# Patient Record
Sex: Female | Born: 1955 | Race: Black or African American | Hispanic: No | Marital: Single | Smoking: Current every day smoker
Health system: Southern US, Community
[De-identification: ages and names within clinical notes are randomized; demographics above are authoritative.]

## PROBLEM LIST (undated history)

## (undated) DIAGNOSIS — I10 Essential (primary) hypertension: Secondary | ICD-10-CM

## (undated) DIAGNOSIS — H9190 Unspecified hearing loss, unspecified ear: Secondary | ICD-10-CM

---

## 2009-01-12 ENCOUNTER — Encounter: Admission: RE | Admit: 2009-01-12 | Discharge: 2009-01-12 | Payer: Self-pay | Admitting: Family Medicine

## 2009-08-25 ENCOUNTER — Emergency Department (HOSPITAL_COMMUNITY): Admission: EM | Admit: 2009-08-25 | Discharge: 2009-08-26 | Payer: Self-pay | Admitting: Emergency Medicine

## 2009-09-13 ENCOUNTER — Ambulatory Visit (HOSPITAL_COMMUNITY)
Admission: RE | Admit: 2009-09-13 | Discharge: 2009-09-13 | Payer: Self-pay | Source: Home / Self Care | Admitting: Obstetrics and Gynecology

## 2009-10-24 ENCOUNTER — Emergency Department (HOSPITAL_COMMUNITY): Admission: EM | Admit: 2009-10-24 | Discharge: 2009-10-25 | Payer: Self-pay | Admitting: Emergency Medicine

## 2010-01-14 ENCOUNTER — Encounter: Admission: RE | Admit: 2010-01-14 | Discharge: 2010-01-14 | Payer: Self-pay | Admitting: Family Medicine

## 2010-10-21 LAB — URINALYSIS, ROUTINE W REFLEX MICROSCOPIC
Bilirubin Urine: NEGATIVE
Glucose, UA: NEGATIVE mg/dL
Ketones, ur: NEGATIVE mg/dL
Nitrite: NEGATIVE
Protein, ur: NEGATIVE mg/dL
Specific Gravity, Urine: 1.015 (ref 1.005–1.030)
Urobilinogen, UA: 0.2 mg/dL (ref 0.0–1.0)
pH: 7 (ref 5.0–8.0)

## 2010-10-21 LAB — WET PREP, GENITAL: Trich, Wet Prep: NONE SEEN

## 2010-10-24 LAB — URINALYSIS, ROUTINE W REFLEX MICROSCOPIC
Ketones, ur: NEGATIVE mg/dL
Leukocytes, UA: NEGATIVE
Protein, ur: NEGATIVE mg/dL
Specific Gravity, Urine: 1.01 (ref 1.005–1.030)
Urobilinogen, UA: 0.2 mg/dL (ref 0.0–1.0)
pH: 6 (ref 5.0–8.0)

## 2010-10-24 LAB — URINE MICROSCOPIC-ADD ON

## 2010-10-24 LAB — BASIC METABOLIC PANEL
BUN: 11 mg/dL (ref 6–23)
Creatinine, Ser: 0.67 mg/dL (ref 0.4–1.2)
GFR calc non Af Amer: >60 mL/min (ref >60)
Glucose, Bld: 94 mg/dL (ref 70–99)
Potassium: 3.2 mEq/L — ABNORMAL LOW (ref 3.5–5.1)
Sodium: 138 mEq/L (ref 135–145)

## 2010-10-24 LAB — CBC
MCHC: 32.5 g/dL (ref 30.0–36.0)
WBC: 4.4 10*3/uL (ref 4.0–10.5)

## 2010-10-24 LAB — ESTRADIOL: Estradiol: 38.2 pg/mL

## 2010-10-24 LAB — FOLLICLE STIMULATING HORMONE: FSH: 49.7 m[IU]/mL

## 2015-03-22 ENCOUNTER — Other Ambulatory Visit: Payer: Self-pay

## 2015-03-22 DIAGNOSIS — Z1231 Encounter for screening mammogram for malignant neoplasm of breast: Secondary | ICD-10-CM

## 2015-04-16 ENCOUNTER — Ambulatory Visit
Admission: RE | Admit: 2015-04-16 | Discharge: 2015-04-16 | Disposition: A | Discharge status: Home / Self Care | Payer: Medicare Other | Source: Ambulatory Visit

## 2015-04-16 DIAGNOSIS — Z1231 Encounter for screening mammogram for malignant neoplasm of breast: Secondary | ICD-10-CM

## 2016-03-13 ENCOUNTER — Other Ambulatory Visit: Payer: Self-pay | Admitting: Nurse Practitioner

## 2016-03-13 ENCOUNTER — Other Ambulatory Visit: Payer: Self-pay | Admitting: Family Medicine

## 2016-03-13 DIAGNOSIS — Z1231 Encounter for screening mammogram for malignant neoplasm of breast: Secondary | ICD-10-CM

## 2016-04-16 ENCOUNTER — Ambulatory Visit
Admission: RE | Admit: 2016-04-16 | Discharge: 2016-04-16 | Disposition: A | Discharge status: Home / Self Care | Payer: Medicare Other | Source: Ambulatory Visit | Attending: Nurse Practitioner | Admitting: Nurse Practitioner

## 2016-04-16 DIAGNOSIS — Z1231 Encounter for screening mammogram for malignant neoplasm of breast: Secondary | ICD-10-CM

## 2016-08-13 ENCOUNTER — Other Ambulatory Visit: Payer: Self-pay | Admitting: Nurse Practitioner

## 2016-08-13 DIAGNOSIS — E2839 Other primary ovarian failure: Secondary | ICD-10-CM

## 2016-08-19 ENCOUNTER — Ambulatory Visit
Admission: RE | Admit: 2016-08-19 | Discharge: 2016-08-19 | Disposition: A | Discharge status: Home / Self Care | Payer: Medicaid Other | Source: Ambulatory Visit | Attending: Nurse Practitioner | Admitting: Nurse Practitioner

## 2016-08-19 DIAGNOSIS — E2839 Other primary ovarian failure: Secondary | ICD-10-CM

## 2017-03-31 ENCOUNTER — Other Ambulatory Visit: Payer: Self-pay | Admitting: Family Medicine

## 2017-03-31 DIAGNOSIS — Z1231 Encounter for screening mammogram for malignant neoplasm of breast: Secondary | ICD-10-CM

## 2017-04-21 ENCOUNTER — Ambulatory Visit
Admission: RE | Admit: 2017-04-21 | Discharge: 2017-04-21 | Disposition: A | Discharge status: Home / Self Care | Payer: Medicare Other | Source: Ambulatory Visit | Attending: Family Medicine | Admitting: Family Medicine

## 2017-04-21 DIAGNOSIS — Z1231 Encounter for screening mammogram for malignant neoplasm of breast: Secondary | ICD-10-CM

## 2017-11-17 ENCOUNTER — Emergency Department (HOSPITAL_COMMUNITY)
Admission: EM | Admit: 2017-11-17 | Discharge: 2017-11-17 | Disposition: A | Discharge status: Home / Self Care | Payer: Medicare Other | Attending: Emergency Medicine | Admitting: Emergency Medicine

## 2017-11-17 ENCOUNTER — Encounter (HOSPITAL_COMMUNITY): Payer: Self-pay | Admitting: Emergency Medicine

## 2017-11-17 DIAGNOSIS — R109 Unspecified abdominal pain: Secondary | ICD-10-CM | POA: Insufficient documentation

## 2017-11-17 DIAGNOSIS — Z5321 Procedure and treatment not carried out due to patient leaving prior to being seen by health care provider: Secondary | ICD-10-CM | POA: Insufficient documentation

## 2017-11-17 LAB — I-STAT BETA HCG BLOOD, ED (MC, WL, AP ONLY)

## 2017-11-17 LAB — COMPREHENSIVE METABOLIC PANEL
ALT: 11 U/L — ABNORMAL LOW (ref 14–54)
ANION GAP: 14 (ref 5–15)
AST: 24 U/L (ref 15–41)
Albumin: 4.3 g/dL (ref 3.5–5.0)
Alkaline Phosphatase: 58 U/L (ref 38–126)
BUN: 13 mg/dL (ref 6–20)
CALCIUM: 9.7 mg/dL (ref 8.9–10.3)
CHLORIDE: 101 mmol/L (ref 101–111)
CO2: 23 mmol/L (ref 22–32)
Creatinine, Ser: 0.83 mg/dL (ref 0.44–1.00)
Glucose, Bld: 144 mg/dL — ABNORMAL HIGH (ref 65–99)
Potassium: 3.7 mmol/L (ref 3.5–5.1)
SODIUM: 138 mmol/L (ref 135–145)
Total Bilirubin: 0.5 mg/dL (ref 0.3–1.2)
Total Protein: 7.6 g/dL (ref 6.5–8.1)

## 2017-11-17 LAB — CBC
HCT: 45 % (ref 36.0–46.0)
HEMOGLOBIN: 15.3 g/dL — AB (ref 12.0–15.0)
MCH: 28.6 pg (ref 26.0–34.0)
MCHC: 34 g/dL (ref 30.0–36.0)
MCV: 84.1 fL (ref 78.0–100.0)
PLATELETS: 173 10*3/uL (ref 150–400)
RBC: 5.35 MIL/uL — AB (ref 3.87–5.11)
RDW: 15.1 % (ref 11.5–15.5)
WBC: 5.3 10*3/uL (ref 4.0–10.5)

## 2017-11-17 LAB — URINALYSIS, ROUTINE W REFLEX MICROSCOPIC
Bacteria, UA: NONE SEEN
Bilirubin Urine: NEGATIVE
GLUCOSE, UA: NEGATIVE mg/dL
KETONES UR: NEGATIVE mg/dL
Nitrite: NEGATIVE
Protein, ur: 30 mg/dL — AB
Specific Gravity, Urine: 1.006 (ref 1.005–1.030)
pH: 7 (ref 5.0–8.0)

## 2017-11-17 LAB — LIPASE, BLOOD: LIPASE: 23 U/L (ref 11–51)

## 2017-11-17 NOTE — ED Triage Notes (Signed)
Triage used via [employer] interpre[employer] - pt speaks ASL. Reports abd pain and nausea x2 hours. Decreased PO intake.

## 2017-11-17 NOTE — ED Notes (Signed)
Called pt x3 for room, no response from pt.  

## 2017-11-19 ENCOUNTER — Other Ambulatory Visit: Payer: Self-pay

## 2017-11-19 ENCOUNTER — Encounter (HOSPITAL_BASED_OUTPATIENT_CLINIC_OR_DEPARTMENT_OTHER): Payer: Self-pay | Admitting: *Deleted

## 2017-11-19 ENCOUNTER — Emergency Department (HOSPITAL_BASED_OUTPATIENT_CLINIC_OR_DEPARTMENT_OTHER)
Admission: EM | Admit: 2017-11-19 | Discharge: 2017-11-20 | Disposition: A | Discharge status: Home / Self Care | Payer: Medicare Other | Attending: Emergency Medicine | Admitting: Emergency Medicine

## 2017-11-19 ENCOUNTER — Emergency Department (HOSPITAL_BASED_OUTPATIENT_CLINIC_OR_DEPARTMENT_OTHER): Payer: Medicare Other

## 2017-11-19 DIAGNOSIS — R1013 Epigastric pain: Secondary | ICD-10-CM | POA: Diagnosis not present

## 2017-11-19 DIAGNOSIS — F17200 Nicotine dependence, unspecified, uncomplicated: Secondary | ICD-10-CM | POA: Insufficient documentation

## 2017-11-19 DIAGNOSIS — R1010 Upper abdominal pain, unspecified: Secondary | ICD-10-CM | POA: Diagnosis present

## 2017-11-19 HISTORY — DX: Unspecified hearing loss, unspecified ear: H91.90

## 2017-11-19 LAB — CBC
HCT: 42.9 % (ref 36.0–46.0)
Hemoglobin: 15 g/dL (ref 12.0–15.0)
MCH: 28.5 pg (ref 26.0–34.0)
MCHC: 35 g/dL (ref 30.0–36.0)
MCV: 81.6 fL (ref 78.0–100.0)
Platelets: 167 10*3/uL (ref 150–400)
RBC: 5.26 MIL/uL — ABNORMAL HIGH (ref 3.87–5.11)
RDW: 14.8 % (ref 11.5–15.5)
WBC: 5.6 10*3/uL (ref 4.0–10.5)

## 2017-11-19 LAB — COMPREHENSIVE METABOLIC PANEL
ALT: 10 U/L — ABNORMAL LOW (ref 14–54)
AST: 20 U/L (ref 15–41)
Albumin: 4.1 g/dL (ref 3.5–5.0)
Alkaline Phosphatase: 42 U/L (ref 38–126)
Anion gap: 12 (ref 5–15)
BUN: 19 mg/dL (ref 6–20)
CHLORIDE: 101 mmol/L (ref 101–111)
CO2: 26 mmol/L (ref 22–32)
Calcium: 9.1 mg/dL (ref 8.9–10.3)
Creatinine, Ser: 0.81 mg/dL (ref 0.44–1.00)
GFR calc Af Amer: >60 mL/min (ref >60)
Glucose, Bld: 91 mg/dL (ref 65–99)
POTASSIUM: 3.5 mmol/L (ref 3.5–5.1)
SODIUM: 139 mmol/L (ref 135–145)
Total Bilirubin: 0.8 mg/dL (ref 0.3–1.2)
Total Protein: 7.1 g/dL (ref 6.5–8.1)

## 2017-11-19 LAB — URINALYSIS, ROUTINE W REFLEX MICROSCOPIC
Bilirubin Urine: NEGATIVE
GLUCOSE, UA: NEGATIVE mg/dL
Ketones, ur: 15 mg/dL — AB
Nitrite: NEGATIVE
Protein, ur: NEGATIVE mg/dL
SPECIFIC GRAVITY, URINE: 1.015 (ref 1.005–1.030)
pH: 6 (ref 5.0–8.0)

## 2017-11-19 LAB — URINALYSIS, MICROSCOPIC (REFLEX)

## 2017-11-19 LAB — LIPASE, BLOOD: LIPASE: 23 U/L (ref 11–51)

## 2017-11-19 MED ORDER — ONDANSETRON HCL 4 MG/2ML IJ SOLN
4.0000 mg | Freq: Once | INTRAMUSCULAR | Status: AC
  Administered 2017-11-19: 4 mg via INTRAVENOUS
  Filled 2017-11-19: qty 2

## 2017-11-19 MED ORDER — IOPAMIDOL (ISOVUE-300) INJECTION 61%
100.0000 mL | Freq: Once | INTRAVENOUS | Status: AC | PRN
  Administered 2017-11-20: 100 mL via INTRAVENOUS

## 2017-11-19 MED ORDER — SODIUM CHLORIDE 0.9 % IV BOLUS
1000.0000 mL | Freq: Once | INTRAVENOUS | Status: AC
  Administered 2017-11-19: 1000 mL via INTRAVENOUS

## 2017-11-19 NOTE — ED Triage Notes (Signed)
Abdominal pain and nausea x 2 days. She was seen in the ED at [facility] with no improvement.

## 2017-11-19 NOTE — ED Provider Notes (Signed)
[facility] EMERGENCY DEPARTMENT Provider Note   CSN: [id number] Arrival date & time: 11/19/17  2004     History   Chief Complaint Chief Complaint  Patient presents with  . Abdominal Pain    HPI Fernando Torry is a 62 y.o. female.  Level 5 caveat for language barrier.  History obtained through sign language interpreter.  Patient presents with upper abdominal pain and nausea and vomiting for the past 2 days.  States the pain started on April 16 after vomiting 3 or 4 times.  She has had constant upper abdominal pain since with poor appetite.  No further vomiting but has had nausea.  She was seen at [facility] but left without being seen 2 days ago.  She denies any vomiting since then.  Has had loose stools but not diarrhea.  She has not had any more vomiting.  No pain with urination or blood in the urine.  No vaginal bleeding or discharge.  No chest pain or shortness of breath.  She denies any history of ulcers or acid reflux.  She has not had this pain before.  No previous abdominal surgeries.  The history is provided by the patient. The history is limited by a language barrier.    Past Medical History:  Diagnosis Date  . Hard of hearing     There are no active problems to display for this patient.   History reviewed. No pertinent surgical history.   OB History   None      Home Medications    Prior to Admission medications   Not on File    Family History No family history on file.  Social History Social History   Tobacco Use  . Smoking status: Current Every Day Smoker  . Smokeless tobacco: Never Used  Substance Use Topics  . Alcohol use: Yes  . Drug use: Never     Allergies   Patient has no known allergies.   Review of Systems Review of Systems  Constitutional: Positive for appetite change. Negative for fatigue.  HENT: Negative for congestion.   Eyes: Negative for visual disturbance.  Respiratory: Negative for cough, chest tightness and  shortness of breath.   Cardiovascular: Negative for chest pain.  Gastrointestinal: Positive for abdominal pain, nausea and vomiting. Negative for diarrhea.  Genitourinary: Negative for dysuria, hematuria, vaginal bleeding and vaginal discharge.  Musculoskeletal: Negative for arthralgias, back pain and myalgias.  Skin: Negative for rash.  Neurological: Negative for dizziness, weakness and headaches.   all other systems are negative except as noted in the HPI and PMH.     Physical Exam Updated Vital Signs BP 136/87 (BP Location: Right Arm)   Pulse 64   Temp 98.1 F (36.7 C) (Oral)   Resp 16   Ht 5\' 6"  (1.676 m)   Wt 68 kg (150 lb)   SpO2 100%   BMI 24.21 kg/m   Physical Exam  Constitutional: She is oriented to person, place, and time. She appears well-developed and well-nourished. No distress.  HENT:  Head: Normocephalic and atraumatic.  Mouth/Throat: Oropharynx is clear and moist. No oropharyngeal exudate.  Eyes: Pupils are equal, round, and reactive to light. Conjunctivae and EOM are normal.  Neck: Normal range of motion. Neck supple.  No meningismus.  Cardiovascular: Normal rate, regular rhythm, normal heart sounds and intact distal pulses.  No murmur heard. Pulmonary/Chest: Effort normal and breath sounds normal. No respiratory distress.  Abdominal: Soft. There is tenderness. There is no rebound and no guarding.  Epigastric upper abdominal tenderness  voluntary guarding No RLQ tenderness No RUQ tenderness  Musculoskeletal: Normal range of motion. She exhibits no edema or tenderness.  Neurological: She is alert and oriented to person, place, and time. No cranial nerve deficit. She exhibits normal muscle tone. Coordination normal.  No ataxia on finger to nose bilaterally. No pronator drift. 5/5 strength throughout. CN 2-12 intact.Equal grip strength. Sensation intact.   Skin: Skin is warm.  Psychiatric: She has a normal mood and affect. Her behavior is normal.  Nursing  note and vitals reviewed.    ED Treatments / Results  Labs (all labs ordered are listed, but only abnormal results are displayed) Labs Reviewed  COMPREHENSIVE METABOLIC PANEL - Abnormal; Notable for the following components:      Result Value   ALT 10 (*)    All other components within normal limits  CBC - Abnormal; Notable for the following components:   RBC 5.26 (*)    All other components within normal limits  URINALYSIS, ROUTINE W REFLEX MICROSCOPIC - Abnormal; Notable for the following components:   Hgb urine dipstick TRACE (*)    Ketones, ur 15 (*)    Leukocytes, UA TRACE (*)    All other components within normal limits  URINALYSIS, MICROSCOPIC (REFLEX) - Abnormal; Notable for the following components:   Bacteria, UA RARE (*)    Squamous Epithelial / LPF 0-5 (*)    All other components within normal limits  LIPASE, BLOOD    EKG None  Radiology Ct Abdomen Pelvis W Contrast  Result Date: 11/20/2017 CLINICAL DATA:  Mid abdominal pain for 2 days with French Anaracy material. EXAM: CT ABDOMEN AND PELVIS WITH CONTRAST TECHNIQUE: Multidetector CT imaging of the abdomen and pelvis was performed using the standard protocol following bolus administration of intravenous contrast. CONTRAST:  100mL ISOVUE-300 IOPAMIDOL (ISOVUE-300) INJECTION 61% COMPARISON:  05/14/2015 CT FINDINGS: Lower chest: Normal heart size without pericardial effusion. There is minimal atelectasis along the dependent aspect of both lung bases. No effusion or pulmonary consolidation. No pneumothorax. Hepatobiliary: Homogeneous appearance of the liver. Nondistended gallbladder without stones. No biliary dilatation. Pancreas: Normal pancreas without inflammation or mass. Spleen: Normal Adrenals/Urinary Tract: Normal bilateral adrenal glands. 9 mm stable cyst in the interpolar left kidney. No obstructive uropathy. Unremarkable urinary bladder without focal mural thickening or calculus. Repeat delayed images demonstrate symmetric  pyelograms. Stomach/Bowel: Physiologic distention of the stomach with normal small bowel rotation. No small bowel dilatation or inflammation. No obstruction. Normal-appearing appendix. Moderate fecal retention within the colon. No large bowel obstruction or mural thickening. There is scattered colonic diverticulosis along the sigmoid colon without acute diverticulitis. Vascular/Lymphatic: Mild aortoiliac atherosclerosis. Circumaortic left renal vein. No adenopathy. Reproductive: Redemonstration of uterine fibroids, the largest is posterior measuring approximately 6.4 cm and without significant change. No adnexal mass. Other: No abdominal wall hernia or abnormality. No abdominopelvic ascites. 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 Gentle S-shaped scoliosis of the thoracolumbar spine. IMPRESSION: 1. Stable appearing uterine fibroids. 2. Sigmoid diverticulosis without acute diverticulitis. No bowel obstruction or inflammation. 3. Stable left interpolar renal cyst approximately 9 mm in diameter. No nephrolithiasis nor enhancing renal masses. Electronically Signed   By: Tollie Ethavid  Kwon M.D.   On: 11/20/2017 01:22    Procedures Procedures (including critical care time)  Medications Ordered in ED Medications - No data to display   Initial Impression / Assessment and Plan / ED Course  I have reviewed the triage vital signs  and the nursing notes.  Pertinent labs & imaging results that were available during my care of the patient were reviewed by me and considered in my medical decision making (see chart for details).     Upper abdominal pain with nausea and vomiting. LFTs and lipase normal.   LFTs and lipase are normal.  Labs are reassuring.  Urinalysis is negative.  Patient feels improved after GI cocktail and IV PPI.  Low suspicion for gallbladder pathology.  CT scan obtained shows fibroids as well as renal cyst.  No acute  pathology.  Will start PPI.  Discussed with patient to avoid alcohol, NSAIDs, caffeine, spicy foods.  Follow-up with PCP.  Return precautions discussed. Final Clinical Impressions(s) / ED Diagnoses   Final diagnoses:  Epigastric pain    ED Discharge Orders    None       Lashon Beringer, Jeannett Senior, MD 11/20/17 0236

## 2017-11-20 DIAGNOSIS — R1013 Epigastric pain: Secondary | ICD-10-CM | POA: Diagnosis not present

## 2017-11-20 MED ORDER — FAMOTIDINE IN NACL 20-0.9 MG/50ML-% IV SOLN
20.0000 mg | Freq: Two times a day (BID) | INTRAVENOUS | Status: DC
  Administered 2017-11-20: 20 mg via INTRAVENOUS
  Filled 2017-11-20: qty 50

## 2017-11-20 MED ORDER — OMEPRAZOLE 20 MG PO CPDR: 20.0000 mg | DELAYED_RELEASE_CAPSULE | Freq: Every day | ORAL | 0 refills | Status: AC

## 2017-11-20 MED ORDER — ONDANSETRON 4 MG PO TBDP
4.0000 mg | ORAL_TABLET | Freq: Once | ORAL | Status: AC
  Administered 2017-11-20: 4 mg via ORAL
  Filled 2017-11-20: qty 1

## 2017-11-20 MED ORDER — GI COCKTAIL ~~LOC~~
30.0000 mL | Freq: Once | ORAL | Status: AC
  Administered 2017-11-20: 30 mL via ORAL
  Filled 2017-11-20: qty 30

## 2017-11-20 NOTE — Discharge Instructions (Addendum)
Your stomach pain is likely coming from your stomach or esophagus.  Take the medication as prescribed.  Avoid alcohol, NSAIDs, caffeine, spicy foods.  Follow-up with your doctor.  Return to the ED if you develop new or worsening symptoms.

## 2017-11-26 ENCOUNTER — Emergency Department (HOSPITAL_COMMUNITY)
Admission: EM | Admit: 2017-11-26 | Discharge: 2017-11-26 | Disposition: A | Discharge status: Home / Self Care | Payer: Medicare Other | Attending: Emergency Medicine | Admitting: Emergency Medicine

## 2017-11-26 ENCOUNTER — Other Ambulatory Visit: Payer: Self-pay

## 2017-11-26 ENCOUNTER — Encounter (HOSPITAL_COMMUNITY): Payer: Self-pay

## 2017-11-26 DIAGNOSIS — K611 Rectal abscess: Secondary | ICD-10-CM | POA: Insufficient documentation

## 2017-11-26 DIAGNOSIS — I10 Essential (primary) hypertension: Secondary | ICD-10-CM | POA: Insufficient documentation

## 2017-11-26 DIAGNOSIS — F17210 Nicotine dependence, cigarettes, uncomplicated: Secondary | ICD-10-CM | POA: Insufficient documentation

## 2017-11-26 HISTORY — DX: Essential (primary) hypertension: I10

## 2017-11-26 MED ORDER — CLINDAMYCIN HCL 150 MG PO CAPS: 300.0000 mg | ORAL_CAPSULE | Freq: Four times a day (QID) | ORAL | 0 refills | Status: AC

## 2017-11-26 NOTE — ED Provider Notes (Signed)
[facility] EMERGENCY DEPARTMENT Provider Note   CSN: [id number] Arrival date & time: 11/26/17  1706     History   Chief Complaint Chief Complaint  Patient presents with  . Hemorrhoids    HPI Amy Baird is a 62 y.o. female.  HPI Amy Baird is a 62 y.o. female with history of hypertension, presents to emergency department complaining of rectal pain.  Patient noticed rectal pain several days ago.  She states there is pain when she has a bowel movement.  Denies any rectal bleeding.  No fever or chills.  States area around her rectum is tender to the touch.  Denies any drainage.  Denies any hemorrhoids.  She has not tried any treatment prior to coming in.  No history of the same.  Denies constipation or straining while having bowel movements.  Past Medical History:  Diagnosis Date  . Hard of hearing   . Hypertension     There are no active problems to display for this patient.   History reviewed. No pertinent surgical history.   OB History   None      Home Medications    Prior to Admission medications   Medication Sig Start Date End Date Taking? Authorizing Provider  omeprazole (PRILOSEC) 20 MG capsule Take 1 capsule (20 mg total) by mouth daily. 11/20/17   Glynn Octave, MD    Family History History reviewed. No pertinent family history.  Social History Social History   Tobacco Use  . Smoking status: Current Every Day Smoker    Packs/day: 0.50    Types: Cigarettes  . Smokeless tobacco: Never Used  Substance Use Topics  . Alcohol use: Never    Frequency: Never  . Drug use: Never     Allergies   Patient has no known allergies.   Review of Systems Review of Systems  Constitutional: Negative for chills and fever.  Respiratory: Negative for cough, chest tightness and shortness of breath.   Cardiovascular: Negative for chest pain, palpitations and leg swelling.  Gastrointestinal: Positive for rectal pain. Negative for  abdominal pain, blood in stool, diarrhea, nausea and vomiting.  Genitourinary: Negative for dysuria, flank pain, pelvic pain, vaginal bleeding, vaginal discharge and vaginal pain.  Musculoskeletal: Negative for arthralgias, myalgias, neck pain and neck stiffness.  Skin: Negative for rash.  Neurological: Negative for dizziness, weakness and headaches.  All other systems reviewed and are negative.    Physical Exam Updated Vital Signs BP (!) 154/84 (BP Location: Right Arm)   Pulse 77   Temp 98.2 F (36.8 C) (Oral)   Resp 16   Ht 5\' 6"  (1.676 m)   Wt 68 kg (150 lb)   SpO2 98%   BMI 24.21 kg/m   Physical Exam  Constitutional: She appears well-developed and well-nourished. No distress.  HENT:  Head: Normocephalic.  Eyes: Conjunctivae are normal.  Neck: Neck supple.  Cardiovascular: Normal rate, regular rhythm and normal heart sounds.  Pulmonary/Chest: Effort normal and breath sounds normal. No respiratory distress. She has no wheezes. She has no rales.  Abdominal: There is no tenderness.  Genitourinary:     Genitourinary Comments: Left perianal abscess, draining.  Abscess appears to be very small, approximately 1 cm in diameter upon palpation.  No other areas of tenderness, erythema, induration.  Rectal exam otherwise unremarkable.  Musculoskeletal: She exhibits no edema.  Neurological: She is alert.  Skin: Skin is warm and dry.  Psychiatric: She has a normal mood and affect. Her behavior is normal.  Nursing note and vitals reviewed.    ED Treatments / Results  Labs (all labs ordered are listed, but only abnormal results are displayed) Labs Reviewed - No data to display  EKG None  Radiology No results found.  Procedures Procedures (including critical care time)  Medications Ordered in ED Medications - No data to display   Initial Impression / Assessment and Plan / ED Course  I have reviewed the triage vital signs and the nursing notes.  Pertinent labs &  imaging results that were available during my care of the patient were reviewed by me and considered in my medical decision making (see chart for details).    Patient with a small perirectal abscess that is already draining.  She is afebrile, nontoxic-appearing.  Rectal exam otherwise unremarkable.  I will start patient on antibiotics, discussed sitz bath's, follow-up with family doctor as needed.  Return precautions discussed.  Advised to come back up is getting larger and stop straining for incision and drainage in the ER.  Vitals:   11/26/17 1736 11/26/17 1737  BP: (!) 154/84   Pulse: 77   Resp: 16   Temp: 98.2 F (36.8 C)   TempSrc: Oral   SpO2: 98%   Weight:  68 kg (150 lb)  Height:  5\' 6"  (1.676 m)     Final Clinical Impressions(s) / ED Diagnoses   Final diagnoses:  Perirectal abscess    ED Discharge Orders        Ordered    clindamycin (CLEOCIN) 150 MG capsule  Every 6 hours     11/26/17 1830       Jaynie CrumbleKirichenko, Gerrod Maule, PA-C 11/26/17 1831    Mesner, Barbara CowerJason, MD 11/30/17 2138

## 2017-11-26 NOTE — ED Notes (Signed)
Deaf Interpretor paged

## 2017-11-26 NOTE — ED Triage Notes (Signed)
Pt endorses hemorrhoid since yesterday. No blood or drainage observed, VSS. Pt is deaf but reads lips. VSS.

## 2017-11-26 NOTE — Discharge Instructions (Addendum)
Warm soapy baths several times a day.  Take antibiotic as prescribed until all gone.  Follow-up with family doctor in 2 to 3 days for recheck.  Return if it is getting worse.

## 2017-11-26 NOTE — ED Notes (Signed)
ED Provider at bedside. 

## 2018-05-28 ENCOUNTER — Other Ambulatory Visit: Payer: Self-pay | Admitting: Family Medicine

## 2018-05-28 DIAGNOSIS — Z1231 Encounter for screening mammogram for malignant neoplasm of breast: Secondary | ICD-10-CM

## 2018-12-14 ENCOUNTER — Other Ambulatory Visit: Payer: Self-pay | Admitting: Family Medicine

## 2018-12-14 DIAGNOSIS — N6320 Unspecified lump in the left breast, unspecified quadrant: Secondary | ICD-10-CM

## 2018-12-31 ENCOUNTER — Other Ambulatory Visit: Payer: Self-pay | Admitting: Nurse Practitioner

## 2019-01-03 ENCOUNTER — Other Ambulatory Visit: Payer: Self-pay | Admitting: Physician Assistant

## 2019-01-03 DIAGNOSIS — N6320 Unspecified lump in the left breast, unspecified quadrant: Secondary | ICD-10-CM

## 2019-01-04 ENCOUNTER — Ambulatory Visit
Admission: RE | Admit: 2019-01-04 | Discharge: 2019-01-04 | Disposition: A | Discharge status: Home / Self Care | Payer: Medicare Other | Source: Ambulatory Visit | Attending: Family Medicine | Admitting: Family Medicine

## 2019-01-04 ENCOUNTER — Other Ambulatory Visit: Payer: Self-pay

## 2019-01-04 DIAGNOSIS — N6320 Unspecified lump in the left breast, unspecified quadrant: Secondary | ICD-10-CM

## 2019-03-29 ENCOUNTER — Other Ambulatory Visit: Payer: Self-pay | Admitting: Nurse Practitioner

## 2019-03-29 DIAGNOSIS — Z1231 Encounter for screening mammogram for malignant neoplasm of breast: Secondary | ICD-10-CM

## 2019-05-13 ENCOUNTER — Ambulatory Visit
Admission: RE | Admit: 2019-05-13 | Discharge: 2019-05-13 | Disposition: A | Discharge status: Home / Self Care | Payer: Medicare Other | Source: Ambulatory Visit | Attending: Nurse Practitioner | Admitting: Nurse Practitioner

## 2019-05-13 ENCOUNTER — Other Ambulatory Visit: Payer: Self-pay

## 2019-05-13 DIAGNOSIS — Z1231 Encounter for screening mammogram for malignant neoplasm of breast: Secondary | ICD-10-CM

## 2020-03-20 ENCOUNTER — Emergency Department (HOSPITAL_COMMUNITY)
Admission: EM | Admit: 2020-03-20 | Discharge: 2020-03-21 | Disposition: A | Discharge status: Home / Self Care | Payer: Medicare Other | Attending: Emergency Medicine | Admitting: Emergency Medicine

## 2020-03-20 DIAGNOSIS — L03114 Cellulitis of left upper limb: Secondary | ICD-10-CM | POA: Diagnosis not present

## 2020-03-20 DIAGNOSIS — I10 Essential (primary) hypertension: Secondary | ICD-10-CM | POA: Diagnosis not present

## 2020-03-20 DIAGNOSIS — T63451A Toxic effect of venom of hornets, accidental (unintentional), initial encounter: Secondary | ICD-10-CM

## 2020-03-20 DIAGNOSIS — F17210 Nicotine dependence, cigarettes, uncomplicated: Secondary | ICD-10-CM | POA: Diagnosis not present

## 2020-03-20 DIAGNOSIS — T63461A Toxic effect of venom of wasps, accidental (unintentional), initial encounter: Secondary | ICD-10-CM | POA: Diagnosis not present

## 2020-03-20 DIAGNOSIS — Z23 Encounter for immunization: Secondary | ICD-10-CM | POA: Diagnosis not present

## 2020-03-20 DIAGNOSIS — M79602 Pain in left arm: Secondary | ICD-10-CM | POA: Diagnosis present

## 2020-03-21 ENCOUNTER — Encounter (HOSPITAL_COMMUNITY): Payer: Self-pay | Admitting: Emergency Medicine

## 2020-03-21 DIAGNOSIS — T63461A Toxic effect of venom of wasps, accidental (unintentional), initial encounter: Secondary | ICD-10-CM | POA: Diagnosis not present

## 2020-03-21 MED ORDER — DOXYCYCLINE HYCLATE 100 MG PO TABS
100.0000 mg | ORAL_TABLET | Freq: Once | ORAL | Status: AC
  Administered 2020-03-21: 100 mg via ORAL
  Filled 2020-03-21: qty 1

## 2020-03-21 MED ORDER — DOXYCYCLINE HYCLATE 100 MG PO CAPS
100.0000 mg | ORAL_CAPSULE | Freq: Two times a day (BID) | ORAL | 0 refills | Status: AC
Start: 2020-03-21 — End: 2020-03-28

## 2020-03-21 MED ORDER — TETANUS-DIPHTH-ACELL PERTUSSIS 5-2.5-18.5 LF-MCG/0.5 IM SUSP
0.5000 mL | Freq: Once | INTRAMUSCULAR | Status: AC
  Administered 2020-03-21: 0.5 mL via INTRAMUSCULAR
  Filled 2020-03-21: qty 0.5

## 2020-03-21 NOTE — ED Triage Notes (Signed)
Patient reports she was stung by bee L FA on Sunday. Mild swelling noted. Patient reports itching. Has not attempted OTC measures. ASL interpreter used.

## 2020-03-21 NOTE — ED Provider Notes (Signed)
[facility] EMERGENCY DEPARTMENT Provider Note  CSN: [id number] Arrival date & time: 03/20/20 2351  Chief Complaint(s) Insect Bite  HPI Amy Baird is a 64 y.o. female here with left arm pain, swelling, and redness after a bee/hornet sting on Saturday. Initially she felt pain related to the sting, which resolved the following day. Yesterday, the area began to hurt and turn red. Pain with palpation. No alleviating factors. No SOB, throat swelling or rash. No fevers, CP.  The history is provided by the patient. The history is limited by a language barrier. A language interpreter was used (ASL).    Past Medical History Past Medical History:  Diagnosis Date   Hard of hearing    Hypertension    There are no problems to display for this patient.  Home Medication(s) Prior to Admission medications   Medication Sig Start Date End Date Taking? Authorizing Provider  clindamycin (CLEOCIN) 150 MG capsule Take 2 capsules (300 mg total) by mouth every 6 (six) hours. 11/26/17   Kirichenko, Lemont Fillers, PA-C  doxycycline (VIBRAMYCIN) 100 MG capsule Take 1 capsule (100 mg total) by mouth 2 (two) times daily for 7 days. 03/21/20 03/28/20  Nira Conn, MD  omeprazole (PRILOSEC) 20 MG capsule Take 1 capsule (20 mg total) by mouth daily. 11/20/17   Glynn Octave, MD                                                                                                                                    Past Surgical History History reviewed. No pertinent surgical history. Family History No family history on file.  Social History Social History   Tobacco Use   Smoking status: Current Every Day Smoker    Packs/day: 0.50    Types: Cigarettes   Smokeless tobacco: Never Used  Substance Use Topics   Alcohol use: Never   Drug use: Never   Allergies Patient has no known allergies.  Review of Systems Review of Systems All other systems are reviewed and are negative for  acute change except as noted in the HPI  Physical Exam Vital Signs  I have reviewed the triage vital signs BP (!) 129/93    Pulse 67    Temp (!) 97.4 F (36.3 C) (Oral)    Resp 16    Ht 5\' 6"  (1.676 m)    Wt 68 kg    SpO2 99%    BMI 24.20 kg/m   Physical Exam Vitals reviewed.  Constitutional:      General: She is not in acute distress.    Appearance: She is well-developed. She is not diaphoretic.  HENT:     Head: Normocephalic and atraumatic.     Right Ear: External ear normal.     Left Ear: External ear normal.     Nose: Nose normal.  Eyes:     General: No scleral icterus.    Conjunctiva/sclera: Conjunctivae normal.  Neck:  Trachea: Phonation normal.  Cardiovascular:     Rate and Rhythm: Normal rate and regular rhythm.  Pulmonary:     Effort: Pulmonary effort is normal. No respiratory distress.     Breath sounds: No stridor.  Abdominal:     General: There is no distension.  Musculoskeletal:        General: Normal range of motion.       Arms:     Cervical back: Normal range of motion.  Neurological:     Mental Status: She is alert and oriented to person, place, and time.  Psychiatric:        Behavior: Behavior normal.     ED Results and Treatments Labs (all labs ordered are listed, but only abnormal results are displayed) Labs Reviewed - No data to display                                                                                                                       EKG  EKG Interpretation  Date/Time:    Ventricular Rate:    PR Interval:    QRS Duration:   QT Interval:    QTC Calculation:   R Axis:     Text Interpretation:        Radiology No results found.  Pertinent labs & imaging results that were available during my care of the patient were reviewed by me and considered in my medical decision making (see chart for details).  Medications Ordered in ED Medications  Tdap (BOOSTRIX) injection 0.5 mL (0.5 mLs Intramuscular Given 03/21/20  0639)  doxycycline (VIBRA-TABS) tablet 100 mg (100 mg Oral Given 03/21/20 1219)                                                                                                                                    Procedures Procedures  (including critical care time)  Medical Decision Making / ED Course I have reviewed the nursing notes for this encounter and the patient's prior records (if available in EHR or on provided paperwork).   Amy Baird was evaluated in Emergency Department on 03/21/2020 for the symptoms described in the history of present illness. She was evaluated in the context of the global COVID-19 pandemic, which necessitated consideration that the patient might be at risk for infection with the SARS-CoV-2 virus that causes COVID-19. Institutional protocols and algorithms that pertain to the evaluation of patients at risk for  COVID-19 are in a state of rapid change based on information released by regulatory bodies including the CDC and federal and state organizations. These policies and algorithms were followed during the patient's care in the ED.  Will treat for likely cellulitis related to hornet sting. No evidence of allergic reaction. Tdap booster given. Doxy dose given.      Final Clinical Impression(s) / ED Diagnoses Final diagnoses:  Hornet sting, accidental or unintentional, initial encounter  Cellulitis of left upper extremity    The patient appears reasonably screened and/or stabilized for discharge and I doubt any other medical condition or other [facility] requiring further screening, evaluation, or treatment in the ED at this time prior to discharge. Safe for discharge with strict return precautions.  Disposition: Discharge  Condition: Good  I have discussed the results, Dx and Tx plan with the patient/family who expressed understanding and agree(s) with the plan. Discharge instructions discussed at length. The patient/family was given strict return precautions  who verbalized understanding of the instructions. No further questions at time of discharge.    ED Discharge Orders         Ordered    doxycycline (VIBRAMYCIN) 100 MG capsule  2 times daily     Discontinue  Reprint     03/21/20 0634            Follow Up: Avel Sensor, FNP [address] [place] [address] [phone]  Schedule an appointment as soon as possible for a visit  If symptoms do not improve or  worsen     This chart was dictated using voice recognition software.  Despite best efforts to proofread,  errors can occur which can change the documentation meaning.   Nira Conn, MD 03/21/20 [phone]

## 2020-03-21 NOTE — ED Notes (Signed)
Verbalized understanding of DC instructions, Rx, follow up care 

## 2020-04-30 ENCOUNTER — Emergency Department (HOSPITAL_COMMUNITY)
Admission: EM | Admit: 2020-04-30 | Discharge: 2020-04-30 | Disposition: A | Discharge status: Home / Self Care | Payer: Medicare Other | Attending: Emergency Medicine | Admitting: Emergency Medicine

## 2020-04-30 ENCOUNTER — Encounter (HOSPITAL_COMMUNITY): Payer: Self-pay | Admitting: Emergency Medicine

## 2020-04-30 ENCOUNTER — Other Ambulatory Visit: Payer: Self-pay

## 2020-04-30 DIAGNOSIS — N760 Acute vaginitis: Secondary | ICD-10-CM | POA: Insufficient documentation

## 2020-04-30 DIAGNOSIS — Z5321 Procedure and treatment not carried out due to patient leaving prior to being seen by health care provider: Secondary | ICD-10-CM | POA: Insufficient documentation

## 2020-04-30 DIAGNOSIS — R3915 Urgency of urination: Secondary | ICD-10-CM | POA: Insufficient documentation

## 2020-04-30 DIAGNOSIS — R109 Unspecified abdominal pain: Secondary | ICD-10-CM | POA: Diagnosis not present

## 2020-04-30 DIAGNOSIS — R300 Dysuria: Secondary | ICD-10-CM | POA: Insufficient documentation

## 2020-04-30 LAB — URINALYSIS, ROUTINE W REFLEX MICROSCOPIC
Bilirubin Urine: NEGATIVE
Glucose, UA: NEGATIVE mg/dL
Ketones, ur: NEGATIVE mg/dL
Nitrite: NEGATIVE
Protein, ur: 30 mg/dL — AB
Specific Gravity, Urine: 1 — ABNORMAL LOW (ref 1.005–1.030)
pH: 7 (ref 5.0–8.0)

## 2020-04-30 LAB — CBC
HCT: 46.1 % — ABNORMAL HIGH (ref 36.0–46.0)
Hemoglobin: 14.8 g/dL (ref 12.0–15.0)
MCH: 27.5 pg (ref 26.0–34.0)
MCHC: 32.1 g/dL (ref 30.0–36.0)
MCV: 85.5 fL (ref 80.0–100.0)
Platelets: 162 10*3/uL (ref 150–400)
RBC: 5.39 MIL/uL — ABNORMAL HIGH (ref 3.87–5.11)
RDW: 14.9 % (ref 11.5–15.5)
WBC: 7.1 10*3/uL (ref 4.0–10.5)
nRBC: 0 % (ref 0.0–0.2)

## 2020-04-30 LAB — COMPREHENSIVE METABOLIC PANEL
ALT: 11 U/L (ref 0–44)
AST: 17 U/L (ref 15–41)
Albumin: 3.7 g/dL (ref 3.5–5.0)
Alkaline Phosphatase: 48 U/L (ref 38–126)
Anion gap: 12 (ref 5–15)
BUN: 12 mg/dL (ref 8–23)
CO2: 26 mmol/L (ref 22–32)
Calcium: 9.2 mg/dL (ref 8.9–10.3)
Chloride: 104 mmol/L (ref 98–111)
Creatinine, Ser: 0.81 mg/dL (ref 0.44–1.00)
GFR calc Af Amer: >60 mL/min (ref >60)
GFR calc non Af Amer: >60 mL/min (ref >60)
Glucose, Bld: 124 mg/dL — ABNORMAL HIGH (ref 70–99)
Potassium: 3.4 mmol/L — ABNORMAL LOW (ref 3.5–5.1)
Sodium: 142 mmol/L (ref 135–145)
Total Bilirubin: 0.6 mg/dL (ref 0.3–1.2)
Total Protein: 6.7 g/dL (ref 6.5–8.1)

## 2020-04-30 LAB — LIPASE, BLOOD: Lipase: 20 U/L (ref 11–51)

## 2020-04-30 NOTE — ED Triage Notes (Signed)
Seen 03/29/20 [facility] and was given medication for Trichmomonas and Bacterial vaginosis. States recently having dysuria pink tinge in color and when urinating urinates a lot with urgency. Used iPad to talk with patient using ASL. Abominal pain 5-8 cramping. Went to doctors office today however could not communicate with patient because they did not have an interpretor.

## 2020-11-28 IMAGING — US ULTRASOUND LEFT BREAST LIMITED
1 series · 3 of 3 positions shown · non-contrast
Comparison: Previous exams performed at [REDACTED] in [REDACTED]
and May 2018.

CLINICAL DATA: Short-term interval follow-up of a likely benign
mass involving the 3 o'clock position of the LEFT breast.

EXAM:
DIGITAL DIAGNOSTIC LEFT MAMMOGRAM WITH CAD AND TOMO
ULTRASOUND LEFT BREAST

[Series 1: ultrasound left breast limited · 0.06mm/px · 3 of 3 slices shown]
[im 1/3]
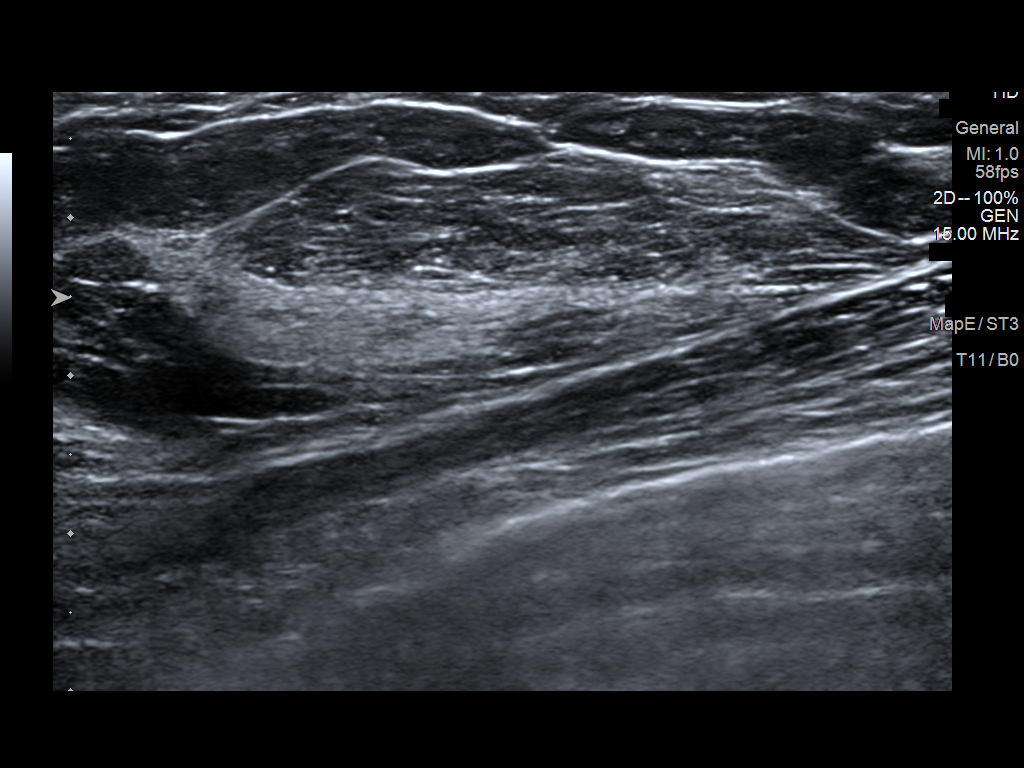
[im 2/3]
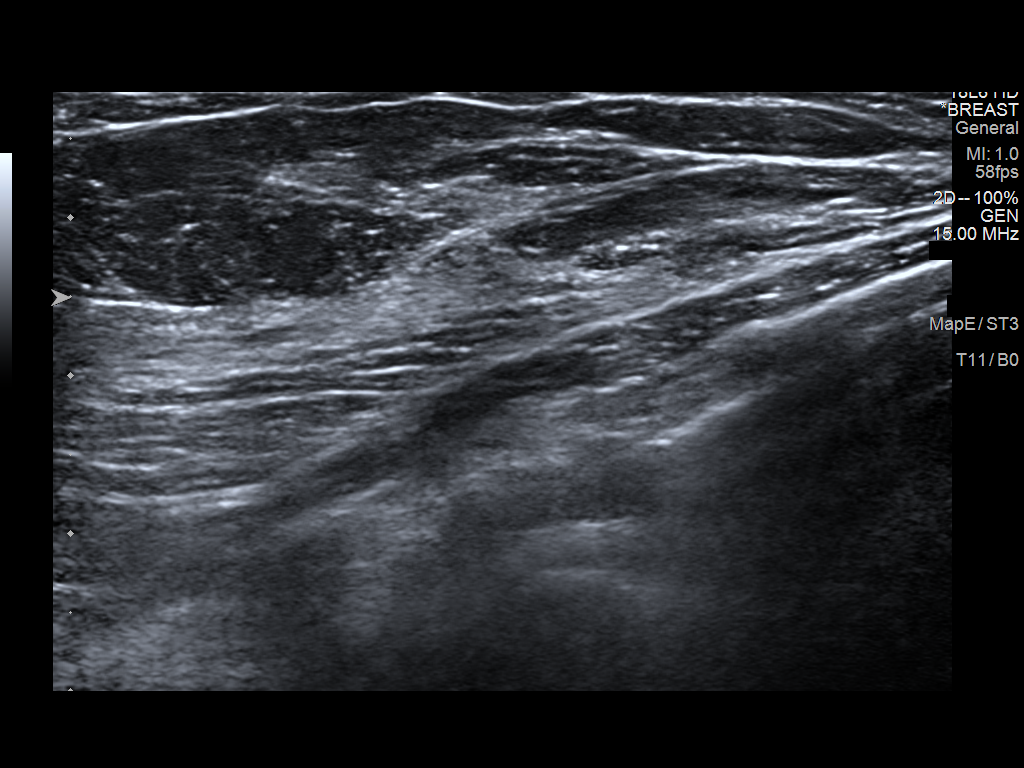
[im 3/3]
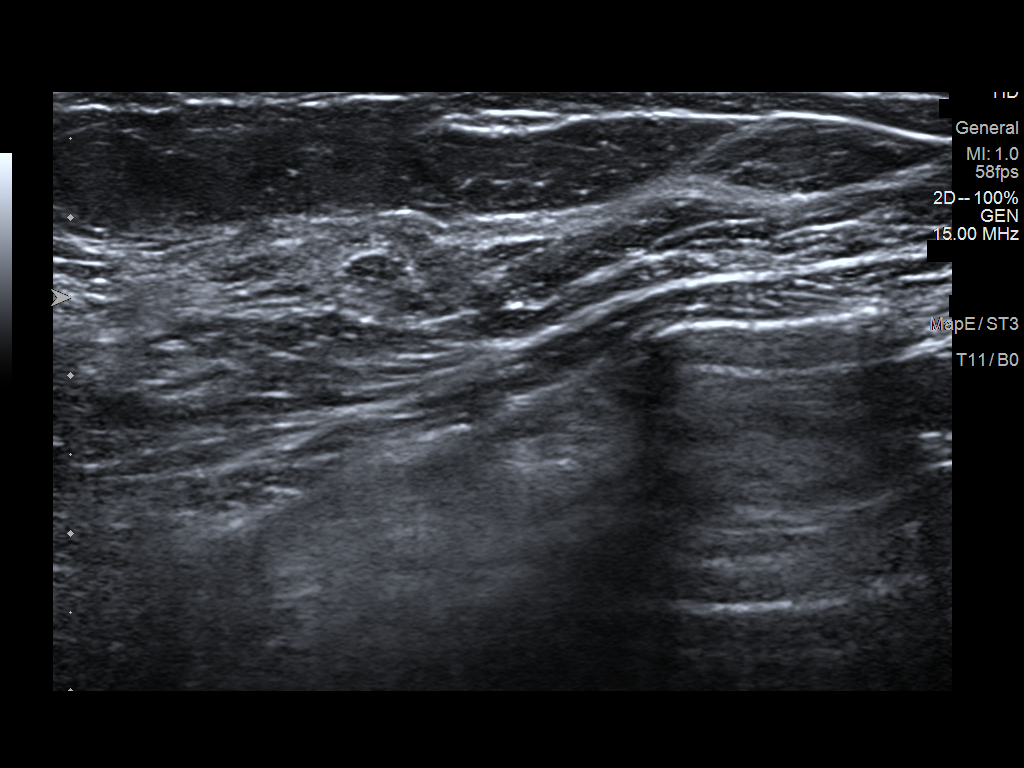

[3 of 3 positions shown; findings below may reference images not displayed]

Prior imaging before that was performed here at
the [REDACTED].

ACR Breast Density Category c: The breast tissue is heterogeneously
dense, which may obscure small masses.
FINDINGS: Tomosynthesis and synthesized full field CC and MLO views of the
LEFT breast were obtained.

The mass in the OUTER LEFT breast at the 3 o'clock location
identified on the prior ultrasound 05/12/2018 is inconspicuous on
today's mammogram. The focal asymmetry identified on the outside
screening mammogram 04/12/2018 represents normal fibroglandular
tissue. No new or suspicious findings in the LEFT breast.

Mammographic images were processed with CAD.

Targeted LEFT breast ultrasound is performed, showing that the
previously identified mass in the OUTER breast has resolved in the
interval. Normal fibroglandular tissue is present from 2 o'clock
through 4 o'clock, including the 3 o'clock position 8 cm from the
nipple where the mass was previously identified. No cyst, suspicious
solid mass or abnormal acoustic shadowing is identified currently.
IMPRESSION: 1. No mammographic or sonographic evidence of malignancy involving
the LEFT breast.
2. Interval resolution of the benign mass involving the OUTER LEFT
breast since the prior outside ultrasound in May 2018.

RECOMMENDATION:
Annual BILATERAL screening mammography which is due in April 2019.

I have discussed the findings and recommendations with the patient.
Communication with the patient was achieved with the assistance of a
certified sign language interpreter. If applicable, a reminder
letter will be sent to the patient regarding the next appointment.

BI-RADS CATEGORY  1: Negative.

## 2021-01-18 DIAGNOSIS — Z1231 Encounter for screening mammogram for malignant neoplasm of breast: Secondary | ICD-10-CM | POA: Diagnosis not present

## 2021-04-06 IMAGING — MG MM DIGITAL SCREENING BILAT W/ TOMO W/ CAD
8 series · 9 of 24 positions shown · non-contrast
Comparison: Previous exam(s).

CLINICAL DATA: Screening.

EXAM:
DIGITAL SCREENING BILATERAL MAMMOGRAM WITH TOMO AND CAD

[R MLO synth-2D]
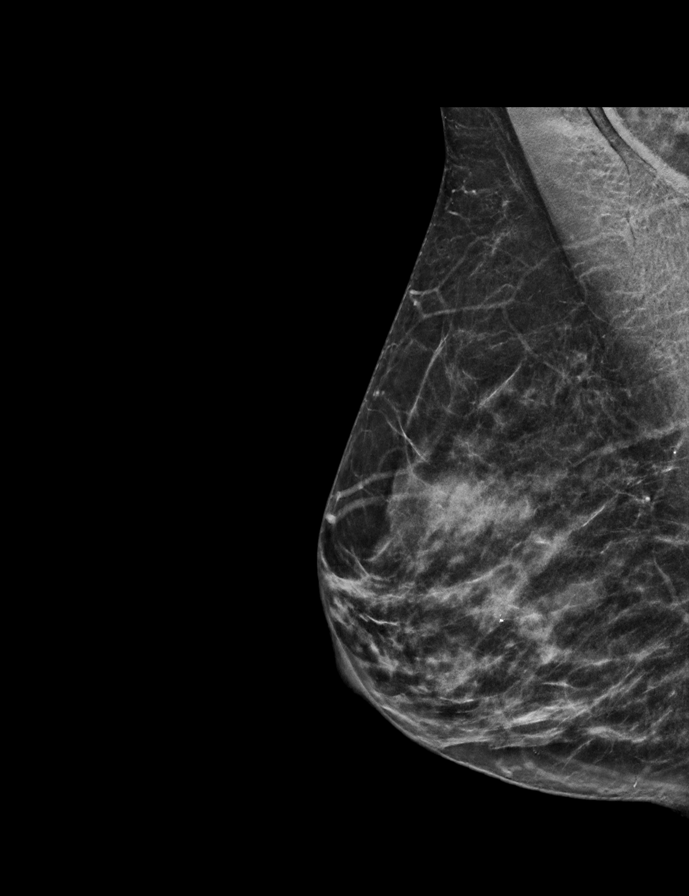

[L CC synth-2D]
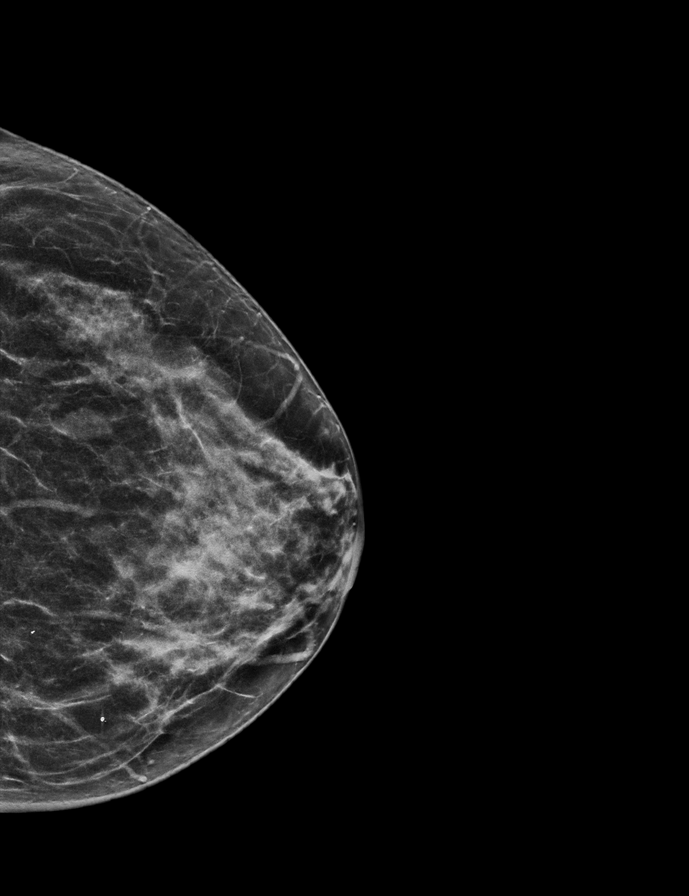

[L MLO synth-2D]
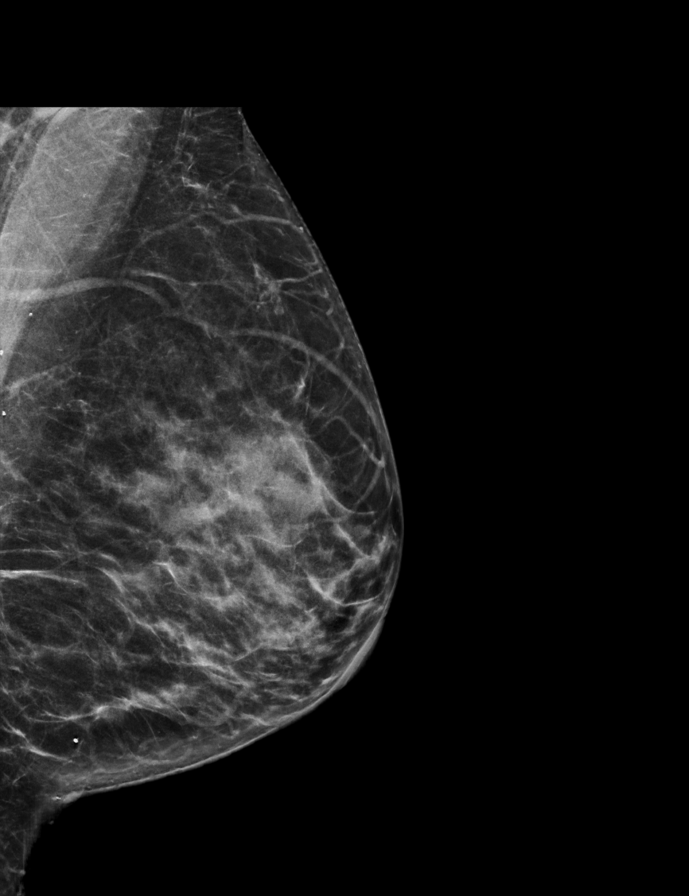

[R CC synth-2D]
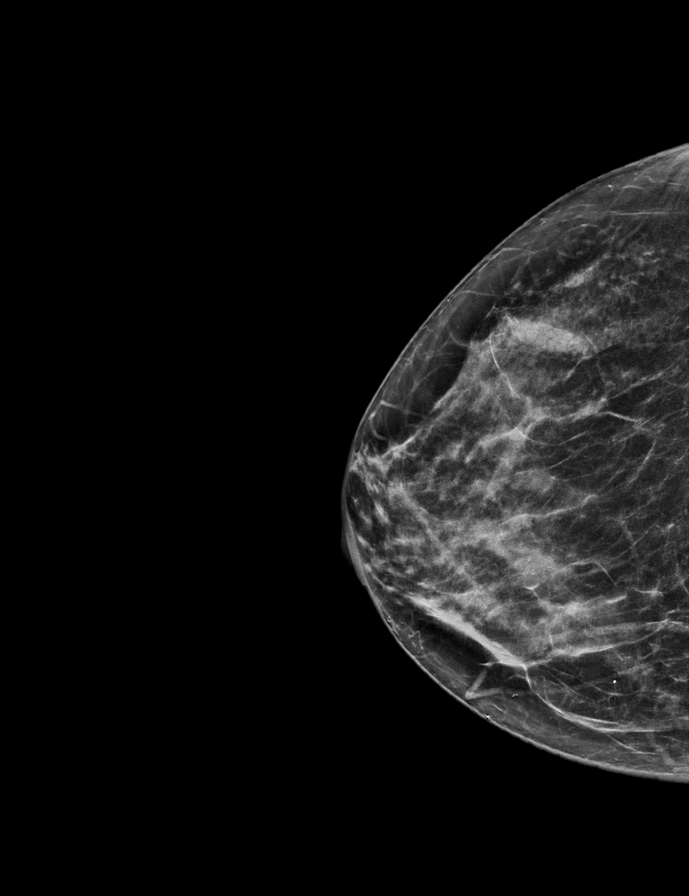

[L CC tomo · 2 of 54 frames shown]
[frame 18/54]
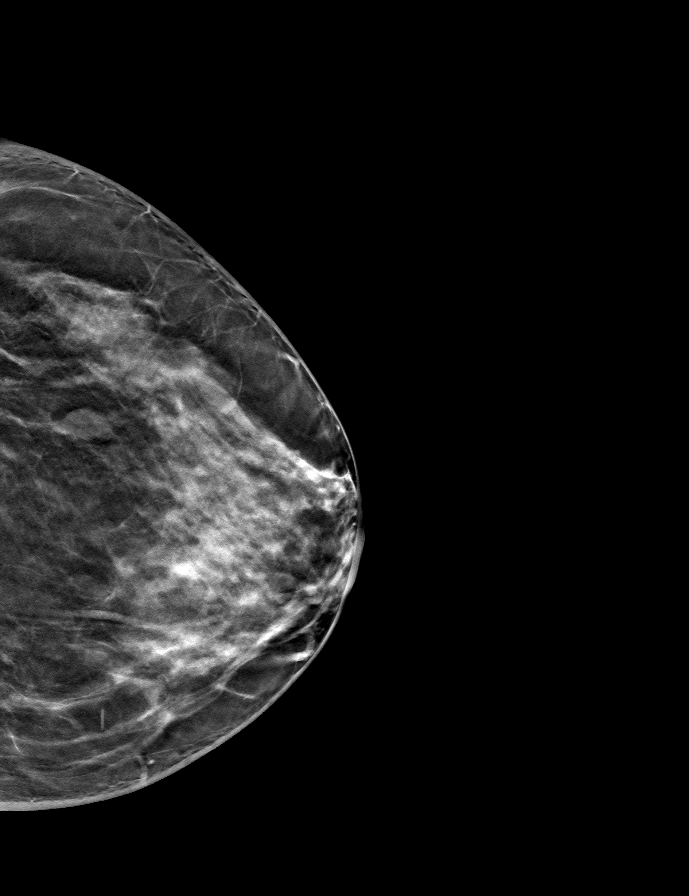
[frame 27/54]
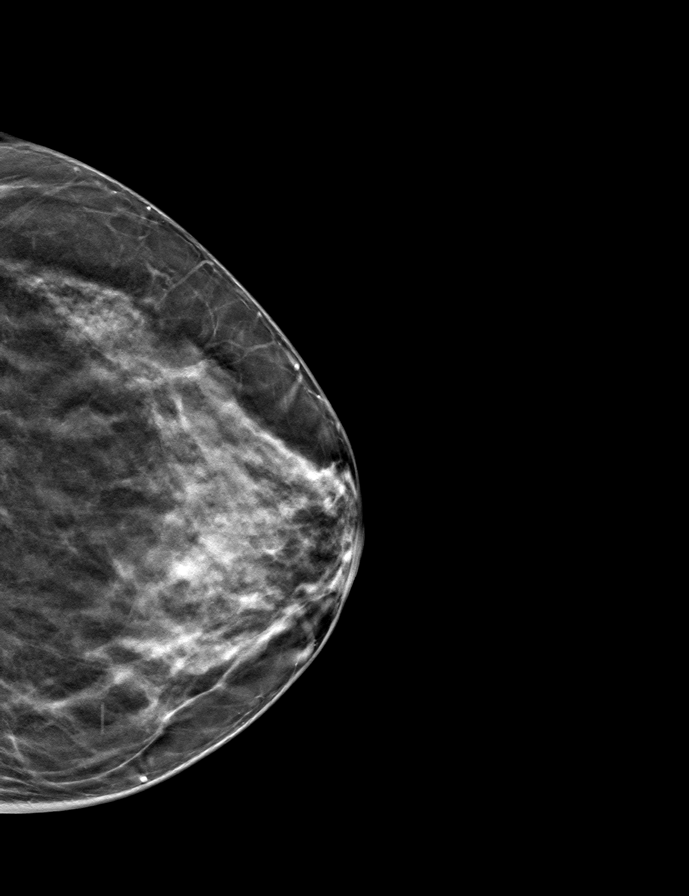

[R CC tomo · tomo slice 27/54.0]
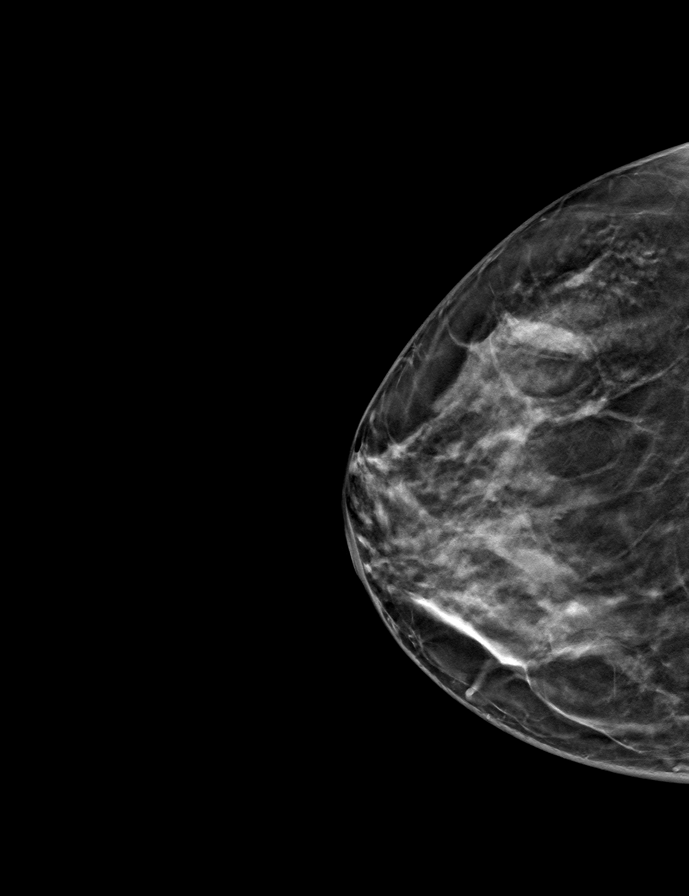

[L MLO tomo · tomo slice 29/57.0]
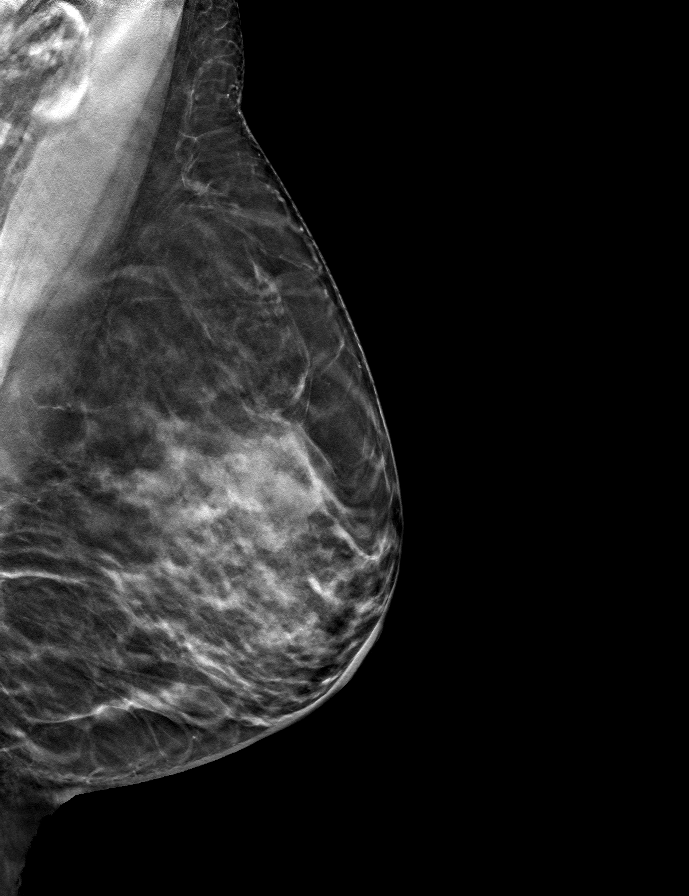

[R MLO tomo · tomo slice 27/53.0]
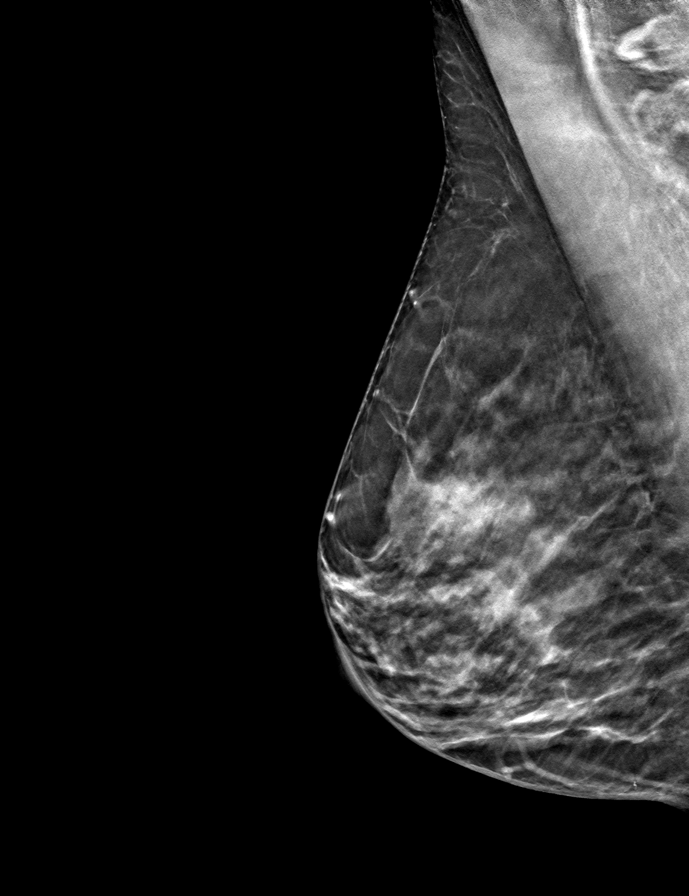

[9 of 24 positions shown; findings below may reference images not displayed]

ACR Breast Density Category c: The breast tissue is heterogeneously
dense, which may obscure small masses.
FINDINGS: There are no findings suspicious for malignancy. Images were
processed with CAD.
IMPRESSION: No mammographic evidence of malignancy. A result letter of this
screening mammogram will be mailed directly to the patient.

RECOMMENDATION:
Screening mammogram in one year. (Code:[id number])

BI-RADS CATEGORY  1: Negative.

## 2021-06-13 DIAGNOSIS — M62838 Other muscle spasm: Secondary | ICD-10-CM | POA: Diagnosis not present

## 2021-06-13 DIAGNOSIS — I10 Essential (primary) hypertension: Secondary | ICD-10-CM | POA: Diagnosis not present

## 2021-06-13 DIAGNOSIS — F17210 Nicotine dependence, cigarettes, uncomplicated: Secondary | ICD-10-CM | POA: Diagnosis not present

## 2021-06-13 DIAGNOSIS — M542 Cervicalgia: Secondary | ICD-10-CM | POA: Diagnosis not present

## 2021-06-13 DIAGNOSIS — E785 Hyperlipidemia, unspecified: Secondary | ICD-10-CM | POA: Diagnosis not present

## 2021-06-13 DIAGNOSIS — Z79899 Other long term (current) drug therapy: Secondary | ICD-10-CM | POA: Diagnosis not present

## 2021-06-13 DIAGNOSIS — K219 Gastro-esophageal reflux disease without esophagitis: Secondary | ICD-10-CM | POA: Diagnosis not present

## 2021-06-13 DIAGNOSIS — M5030 Other cervical disc degeneration, unspecified cervical region: Secondary | ICD-10-CM | POA: Diagnosis not present

## 2021-06-24 DIAGNOSIS — S134XXA Sprain of ligaments of cervical spine, initial encounter: Secondary | ICD-10-CM | POA: Diagnosis not present

## 2021-06-24 DIAGNOSIS — E119 Type 2 diabetes mellitus without complications: Secondary | ICD-10-CM | POA: Diagnosis not present

## 2021-06-24 DIAGNOSIS — M47812 Spondylosis without myelopathy or radiculopathy, cervical region: Secondary | ICD-10-CM | POA: Diagnosis not present

## 2021-07-16 DIAGNOSIS — Z23 Encounter for immunization: Secondary | ICD-10-CM | POA: Diagnosis not present

## 2021-07-16 DIAGNOSIS — Z0000 Encounter for general adult medical examination without abnormal findings: Secondary | ICD-10-CM | POA: Diagnosis not present

## 2021-07-16 DIAGNOSIS — Z7185 Encounter for immunization safety counseling: Secondary | ICD-10-CM | POA: Diagnosis not present

## 2021-07-16 DIAGNOSIS — I10 Essential (primary) hypertension: Secondary | ICD-10-CM | POA: Diagnosis not present

## 2022-01-03 DIAGNOSIS — H2513 Age-related nuclear cataract, bilateral: Secondary | ICD-10-CM | POA: Diagnosis not present

## 2022-01-03 DIAGNOSIS — H10413 Chronic giant papillary conjunctivitis, bilateral: Secondary | ICD-10-CM | POA: Diagnosis not present

## 2022-01-03 DIAGNOSIS — H0102B Squamous blepharitis left eye, upper and lower eyelids: Secondary | ICD-10-CM | POA: Diagnosis not present

## 2022-01-03 DIAGNOSIS — H18519 Endothelial corneal dystrophy, unspecified eye: Secondary | ICD-10-CM | POA: Diagnosis not present

## 2022-01-03 DIAGNOSIS — H0102A Squamous blepharitis right eye, upper and lower eyelids: Secondary | ICD-10-CM | POA: Diagnosis not present

## 2022-02-14 DIAGNOSIS — N6452 Nipple discharge: Secondary | ICD-10-CM | POA: Diagnosis not present

## 2022-02-14 DIAGNOSIS — N6459 Other signs and symptoms in breast: Secondary | ICD-10-CM | POA: Diagnosis not present

## 2022-02-25 DIAGNOSIS — N3281 Overactive bladder: Secondary | ICD-10-CM | POA: Diagnosis not present

## 2022-03-19 DIAGNOSIS — G8929 Other chronic pain: Secondary | ICD-10-CM | POA: Diagnosis not present

## 2022-03-19 DIAGNOSIS — M25512 Pain in left shoulder: Secondary | ICD-10-CM | POA: Diagnosis not present

## 2022-04-25 DIAGNOSIS — Z760 Encounter for issue of repeat prescription: Secondary | ICD-10-CM | POA: Diagnosis not present

## 2022-04-25 DIAGNOSIS — Z59819 Housing instability, housed unspecified: Secondary | ICD-10-CM | POA: Diagnosis not present

## 2022-04-25 DIAGNOSIS — I10 Essential (primary) hypertension: Secondary | ICD-10-CM | POA: Diagnosis not present

## 2022-04-25 DIAGNOSIS — Z23 Encounter for immunization: Secondary | ICD-10-CM | POA: Diagnosis not present

## 2022-04-25 DIAGNOSIS — Z0000 Encounter for general adult medical examination without abnormal findings: Secondary | ICD-10-CM | POA: Diagnosis not present

## 2022-04-25 DIAGNOSIS — E876 Hypokalemia: Secondary | ICD-10-CM | POA: Diagnosis not present

## 2022-04-25 DIAGNOSIS — E119 Type 2 diabetes mellitus without complications: Secondary | ICD-10-CM | POA: Diagnosis not present

## 2022-07-03 DIAGNOSIS — R634 Abnormal weight loss: Secondary | ICD-10-CM | POA: Diagnosis not present

## 2022-07-03 DIAGNOSIS — R1084 Generalized abdominal pain: Secondary | ICD-10-CM | POA: Diagnosis not present

## 2022-07-03 DIAGNOSIS — K219 Gastro-esophageal reflux disease without esophagitis: Secondary | ICD-10-CM | POA: Diagnosis not present

## 2022-07-03 DIAGNOSIS — R1010 Upper abdominal pain, unspecified: Secondary | ICD-10-CM | POA: Diagnosis not present

## 2022-07-03 DIAGNOSIS — K589 Irritable bowel syndrome without diarrhea: Secondary | ICD-10-CM | POA: Diagnosis not present

## 2022-07-03 DIAGNOSIS — Z1211 Encounter for screening for malignant neoplasm of colon: Secondary | ICD-10-CM | POA: Diagnosis not present

## 2022-08-19 DIAGNOSIS — E785 Hyperlipidemia, unspecified: Secondary | ICD-10-CM | POA: Diagnosis not present

## 2022-08-19 DIAGNOSIS — E1159 Type 2 diabetes mellitus with other circulatory complications: Secondary | ICD-10-CM | POA: Diagnosis not present

## 2022-08-19 DIAGNOSIS — Z5900 Homelessness unspecified: Secondary | ICD-10-CM | POA: Diagnosis not present

## 2022-08-19 DIAGNOSIS — I152 Hypertension secondary to endocrine disorders: Secondary | ICD-10-CM | POA: Diagnosis not present

## 2022-08-19 DIAGNOSIS — E1169 Type 2 diabetes mellitus with other specified complication: Secondary | ICD-10-CM | POA: Diagnosis not present

## 2022-08-19 DIAGNOSIS — M25512 Pain in left shoulder: Secondary | ICD-10-CM | POA: Diagnosis not present

## 2022-08-25 DIAGNOSIS — M25512 Pain in left shoulder: Secondary | ICD-10-CM | POA: Diagnosis not present

## 2022-09-22 DIAGNOSIS — Z124 Encounter for screening for malignant neoplasm of cervix: Secondary | ICD-10-CM | POA: Diagnosis not present

## 2022-09-22 DIAGNOSIS — Z113 Encounter for screening for infections with a predominantly sexual mode of transmission: Secondary | ICD-10-CM | POA: Diagnosis not present

## 2022-09-22 DIAGNOSIS — Z23 Encounter for immunization: Secondary | ICD-10-CM | POA: Diagnosis not present

## 2022-09-24 DIAGNOSIS — Z1211 Encounter for screening for malignant neoplasm of colon: Secondary | ICD-10-CM | POA: Diagnosis not present

## 2022-11-27 DIAGNOSIS — M25512 Pain in left shoulder: Secondary | ICD-10-CM | POA: Diagnosis not present

## 2022-11-27 DIAGNOSIS — E1169 Type 2 diabetes mellitus with other specified complication: Secondary | ICD-10-CM | POA: Diagnosis not present

## 2022-11-27 DIAGNOSIS — E1159 Type 2 diabetes mellitus with other circulatory complications: Secondary | ICD-10-CM | POA: Diagnosis not present

## 2022-11-27 DIAGNOSIS — G8929 Other chronic pain: Secondary | ICD-10-CM | POA: Diagnosis not present

## 2022-11-27 DIAGNOSIS — H9193 Unspecified hearing loss, bilateral: Secondary | ICD-10-CM | POA: Diagnosis not present

## 2022-12-22 DIAGNOSIS — E1169 Type 2 diabetes mellitus with other specified complication: Secondary | ICD-10-CM | POA: Diagnosis not present

## 2022-12-22 DIAGNOSIS — L603 Nail dystrophy: Secondary | ICD-10-CM | POA: Diagnosis not present

## 2022-12-22 DIAGNOSIS — E1159 Type 2 diabetes mellitus with other circulatory complications: Secondary | ICD-10-CM | POA: Diagnosis not present

## 2022-12-22 DIAGNOSIS — M25512 Pain in left shoulder: Secondary | ICD-10-CM | POA: Diagnosis not present
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
## 2023-01-22 ENCOUNTER — Ambulatory Visit: Payer: 59 | Attending: Physician Assistant

## 2023-01-22 DIAGNOSIS — G8929 Other chronic pain: Secondary | ICD-10-CM | POA: Diagnosis present

## 2023-01-22 DIAGNOSIS — M25512 Pain in left shoulder: Secondary | ICD-10-CM | POA: Insufficient documentation

## 2023-01-22 DIAGNOSIS — M6281 Muscle weakness (generalized): Secondary | ICD-10-CM | POA: Insufficient documentation

## 2023-01-28 ENCOUNTER — Ambulatory Visit: Payer: 59

## 2023-01-28 DIAGNOSIS — G8929 Other chronic pain: Secondary | ICD-10-CM

## 2023-01-28 DIAGNOSIS — M6281 Muscle weakness (generalized): Secondary | ICD-10-CM

## 2023-01-28 DIAGNOSIS — M25512 Pain in left shoulder: Secondary | ICD-10-CM | POA: Diagnosis not present
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
## 2023-02-03 ENCOUNTER — Ambulatory Visit: Payer: 59 | Attending: Physician Assistant

## 2023-02-03 DIAGNOSIS — G8929 Other chronic pain: Secondary | ICD-10-CM | POA: Insufficient documentation

## 2023-02-03 DIAGNOSIS — M6281 Muscle weakness (generalized): Secondary | ICD-10-CM | POA: Insufficient documentation

## 2023-02-03 DIAGNOSIS — M25512 Pain in left shoulder: Secondary | ICD-10-CM | POA: Diagnosis present

## 2023-02-03 NOTE — Therapy (Signed)
OUTPATIENT PHYSICAL THERAPY TREATMENT NOTE   Patient Name: Amy Baird MRN: [id number] DOB:06/01/1956, 67 y.o., female Today's Date: 02/03/2023  END OF SESSION:  PT End of Session - 02/03/23 1520     Visit Number 3    Number of Visits 17    Date for PT Re-Evaluation 03/19/23    Authorization Type [employer] MCR and MCD    PT Start Time 1525    PT Stop Time 1605    PT Time Calculation (min) 40 min    Activity Tolerance Patient tolerated treatment well    Behavior During Therapy WFL for tasks assessed/performed               Past Medical History:  Diagnosis Date   Hard of hearing    Hypertension    History reviewed. No pertinent surgical history. There are no problems to display for this patient.   PCP: Barbarann Ehlers   REFERRING PROVIDER: Barbarann Ehlers   REFERRING DIAG:  W09.811 (ICD-10-CM) - Pain in left shoulder G89.29 (ICD-10-CM) - Other chronic pain  THERAPY DIAG:  Chronic left shoulder pain  Muscle weakness (generalized)  Rationale for Evaluation and Treatment: Rehabilitation  ONSET DATE: Chronic - November 2022  SUBJECTIVE:                                                                                                                                                                                      SUBJECTIVE STATEMENT: Patient reports continued HEP compliance as well as moderate shoulder pain today.   Hand dominance: Right  PERTINENT HISTORY: HTN  PAIN:  Are you having pain?  No: NPRS scale: 5/10 Worst: 5/10 Pain location: L shoulder, L upper trap Pain description: sharp, tight Aggravating factors: reaching OH, lifting  Relieving factors: heat  PRECAUTIONS: None  WEIGHT BEARING RESTRICTIONS: No  FALLS:  Has patient fallen in last 6 months? No  LIVING ENVIRONMENT: Lives with: lives with their family Lives in: House/apartment  OCCUPATION: Not currently working (would like to get back)  PLOF:  Independent  PATIENT GOALS: decrease shoulder pain, improve lifting, get back to working a part time job  OBJECTIVE:   DIAGNOSTIC FINDINGS:  N/A  PATIENT SURVEYS:  FOTO: 74% function  COGNITION: Overall cognitive status: Within functional limits for tasks assessed     SENSATION: WFL  POSTURE: Rounded shoulders, fwd head  UPPER EXTREMITY ROM:   Active ROM Right eval Left eval  Shoulder flexion WNL WNL (with pain and hiking)  Shoulder extension    Shoulder abduction WNL WNL (with pain and hiking)  Shoulder adduction    Shoulder internal rotation WNL WNL with pain  Shoulder  external rotation WNL WNL  Elbow flexion    Elbow extension    Wrist flexion    Wrist extension    Wrist ulnar deviation    Wrist radial deviation    Wrist pronation    Wrist supination    (Blank rows = not tested)  UPPER EXTREMITY MMT:  MMT Right eval Left eval  Shoulder flexion 5/5 5/5  Shoulder extension    Shoulder abduction 5/5 5/5  Shoulder adduction    Shoulder internal rotation 5/5 4/5  Shoulder external rotation 5/5 3+/5 with pain  Middle trapezius    Lower trapezius    Elbow flexion    Elbow extension    Wrist flexion    Wrist extension    Wrist ulnar deviation    Wrist radial deviation    Wrist pronation    Wrist supination    Grip strength (lbs)    (Blank rows = not tested)  SHOULDER SPECIAL TESTS: Impingement tests: Neer impingement test: negative and Painful arc test: positive  SLAP lesions: DNT Instability tests: DNT Rotator cuff assessment: DNT Biceps assessment: DNT  JOINT MOBILITY TESTING:  WNL  PALPATION:  TTP to L infraspinatus, L upper trap   TREATMENT: [facility] Adult PT Treatment:                                                DATE: 02/03/23 Therapeutic Exercise: UBE level 1.0 2'/2' fwd/bwd Lifting 2# DB into cabinet bottom/middle shelf 2x10 LUE flexion Rows 17# 2x10 Shoulder extension 13# 2x10 ER/IR 3# x10 each LUE Seated double ER with scap  retraction RTB 2x10 Seated horizontal abduction 2x10 RTB Seated diagonals RTB x10 BIL Seated upper trap stretch 2x30" BIL   [facility] Adult PT Treatment:                                                DATE: 01/28/23 Therapeutic Exercise: UBE level 1.0 2'/2' fwd/bwd Lifting 1# DB into cabinet bottom/middle shelf x10 LUE flexion, 2# x10 Doorway pec stretch 2x30" Rows 13# x10, 17# x10 Shoulder extension 10# 2x10 ER/IR 3# x10 each LUE Seated double ER with scap retraction RTB 2x10 Seated horizontal abduction 2x10 RTB Seated diagonals RTB x10 BIL Seated upper trap stretch 2x30" BIL Seated levator scap stretch 2x30" BIL   [facility] Adult PT Treatment:                                                DATE: 01/22/2023 Therapeutic Exercise: L upper trap stretch x 30" Row GTB x 10 R shoulder ER isometric x 5 - 5" hold Seated scapular retractions x 5  PATIENT EDUCATION: Education details: eval findings, FOTO, HEP, POC Person educated: Patient Education method: Explanation, Demonstration, and Handouts Education comprehension: verbalized understanding and returned demonstration  HOME EXERCISE PROGRAM: Access Code: [email or web address] URL: [email or web address] Date: 02/03/2023 Prepared by: Berta Minor  Exercises - Seated Upper Trapezius Stretch (Mirrored)  - 1 x daily - 7 x weekly - 2 reps - 30 sec hold - Standing Shoulder Row with Anchored Resistance  - 1 x daily - 7 x weekly -  3 sets - 10 reps - gree band hold - Standing Isometric Shoulder External Rotation with Doorway and Towel Roll  - 1 x daily - 7 x weekly - 2-3 sets - 10 reps - 5 sec hold - Shoulder External Rotation and Scapular Retraction with Resistance  - 1 x daily - 7 x weekly - 3 sets - 10 reps - Standing Shoulder Horizontal Abduction with Resistance  - 1 x daily - 7 x weekly - 3 sets - 10 reps - Standing Shoulder Diagonal Horizontal Abduction 60/120 Degrees with Resistance  - 1 x daily - 7 x weekly - 2 sets - 10  reps  ASSESSMENT:  CLINICAL IMPRESSION: Patient presents to PT reporting continued moderate levels of pain in her Lt shoulder. Session today continued to focus on RTC and periscapular strengthening. Updated HEP this session with patient demonstrating understanding of each exercise to utilize while she is out of town before she returns to PT in a few weeks. She will call the office to return to PT once she is back in town in August.    OBJECTIVE IMPAIRMENTS: decreased activity tolerance, decreased mobility, decreased ROM, decreased strength, impaired UE functional use, and pain.   ACTIVITY LIMITATIONS: carrying, lifting, and reach over head  PARTICIPATION LIMITATIONS: driving, shopping, community activity, occupation, and yard work  PERSONAL FACTORS: Time since onset of injury/illness/exacerbation and 1-2 comorbidities: HTN  are also affecting patient's functional outcome.   REHAB POTENTIAL: Excellent  CLINICAL DECISION MAKING: Stable/uncomplicated  EVALUATION COMPLEXITY: Low   GOALS: Goals reviewed with patient? No  SHORT TERM GOALS: Target date: 02/11/2023   Pt will be compliant and knowledgeable with initial HEP for improved comfort and carryover Baseline: initial HEP given  Goal status: INITIAL  2.  Pt will self report left shoulder pain no greater than 3/10 for improved comfort and functional ability Baseline: 5/10 at worst Goal status: INITIAL   LONG TERM GOALS: Target date: 03/18/2023   Pt will improve FOTO function score to no less than 80% as proxy for functional improvement with home ADLs and community activities  Baseline: 74% function Goal status: INITIAL   2.  Pt will self report left shoulder pain no greater than 1/10 for improved comfort and functional ability Baseline: 5/10 at worst Goal status: INITIAL   3.  Pt will improve left shoulder flexion/abd/IR to WNL without hiking compensation or pain through range for improved comfort and functional ability with  home ADLs and community activity Baseline: See ROM chart Goal status: INITIAL  4.  Pt will improve left shoulder IR/ER strength to no less than 5/5 for improved dynamic stabilization with OH motions Baseline: see MMT chart Goal status: INITIAL  5.  Pt will be able to lift 25# OH to cabinet at shoulder height for improved functional ability and potentially get back to working part time Baseline: unable Goal status: INITIAL   PLAN:  PT FREQUENCY: 1-2x/week  PT DURATION: 8 weeks  PLANNED INTERVENTIONS: Therapeutic exercises, Therapeutic activity, Neuromuscular re-education, Balance training, Gait training, Patient/Family education, Self Care, Joint mobilization, Dry Needling, Electrical stimulation, Cryotherapy, Moist heat, Vasopneumatic device, Manual therapy, and Re-evaluation  PLAN FOR NEXT SESSION: assess HEP response, pec minor and upper trap stretching, RTC and periscapular strengthening   Berta Minor, PTA 02/03/2023, 3:20 PM

## 2023-04-01 ENCOUNTER — Ambulatory Visit: Payer: 59 | Attending: Physician Assistant

## 2023-04-01 DIAGNOSIS — M6281 Muscle weakness (generalized): Secondary | ICD-10-CM | POA: Insufficient documentation

## 2023-04-01 DIAGNOSIS — M25512 Pain in left shoulder: Secondary | ICD-10-CM | POA: Insufficient documentation

## 2023-04-01 DIAGNOSIS — G8929 Other chronic pain: Secondary | ICD-10-CM | POA: Diagnosis present

## 2023-04-01 NOTE — Addendum Note (Signed)
Addended by: Eloy End on: 04/01/2023 09:46 AM   Modules accepted: Orders

## 2023-04-01 NOTE — Therapy (Addendum)
OUTPATIENT PHYSICAL THERAPY TREATMENT NOTE/RE-EVALUATION   Patient Name: Amy Baird MRN: [id number] DOB:1955/10/25, 67 y.o., female Today's Date: 04/01/2023  END OF SESSION:  PT End of Session - 04/01/23 0842     Visit Number 4    Number of Visits 17    Date for PT Re-Evaluation 05/13/23    Authorization Type [employer] MCR and MCD    PT Start Time 0845    PT Stop Time 0925    PT Time Calculation (min) 40 min    Activity Tolerance Patient tolerated treatment well    Behavior During Therapy [facility] for tasks assessed/performed                Past Medical History:  Diagnosis Date   Hard of hearing    Hypertension    History reviewed. No pertinent surgical history. There are no problems to display for this patient.   PCP: Barbarann Ehlers   REFERRING PROVIDER: Barbarann Ehlers   REFERRING DIAG:  W09.811 (ICD-10-CM) - Pain in left shoulder G89.29 (ICD-10-CM) - Other chronic pain  THERAPY DIAG:  Chronic left shoulder pain  Muscle weakness (generalized)  Rationale for Evaluation and Treatment: Rehabilitation  ONSET DATE: Chronic - November 2022  SUBJECTIVE:                                                                                                                                                                                      SUBJECTIVE STATEMENT: Pt presents to PT with reports of continued L shoulder pain although it has been doing well.   Hand dominance: Right  PERTINENT HISTORY: HTN  PAIN:  Are you having pain?  No: NPRS scale: 0/10 Worst: 8/10 Pain location: L shoulder, L upper trap Pain description: sharp, tight Aggravating factors: reaching OH, lifting  Relieving factors: heat  PRECAUTIONS: None  WEIGHT BEARING RESTRICTIONS: No  FALLS:  Has patient fallen in last 6 months?2 No  LIVING ENVIRONMENT: Lives with: lives with their family Lives in: House/apartment  OCCUPATION: Not currently working (would like to get  back)  PLOF: Independent  PATIENT GOALS: decrease shoulder pain, improve lifting, get back to working a part time job  OBJECTIVE:   DIAGNOSTIC FINDINGS:  N/A  PATIENT SURVEYS:  FOTO: 74% function; 66% predicted 04/01/2023: 57% function  COGNITION: Overall cognitive status: Within functional limits for tasks assessed     SENSATION: WFL  POSTURE: Rounded shoulders, fwd head  UPPER EXTREMITY ROM:   Active ROM Right eval Left eval  Shoulder flexion WNL WNL (with pain and hiking)  Shoulder extension    Shoulder abduction WNL WNL (with pain and hiking)  Shoulder adduction  Shoulder internal rotation WNL WNL with pain  Shoulder external rotation WNL WNL  Elbow flexion    Elbow extension    Wrist flexion    Wrist extension    Wrist ulnar deviation    Wrist radial deviation    Wrist pronation    Wrist supination    (Blank rows = not tested)  UPPER EXTREMITY MMT:  MMT Right eval Left eval Left 04/01/2023  Shoulder flexion 5/5 5/5   Shoulder extension     Shoulder abduction 5/5 5/5   Shoulder adduction     Shoulder internal rotation 5/5 4/5   Shoulder external rotation 5/5 3+/5 with pain   Middle trapezius     Lower trapezius     Elbow flexion     Elbow extension     Wrist flexion     Wrist extension     Wrist ulnar deviation     Wrist radial deviation     Wrist pronation     Wrist supination     Grip strength (lbs)     (Blank rows = not tested)  SHOULDER SPECIAL TESTS: Impingement tests: Neer impingement test: negative and Painful arc test: positive  SLAP lesions: DNT Instability tests: DNT Rotator cuff assessment: DNT Biceps assessment: DNT  JOINT MOBILITY TESTING:  WNL  PALPATION:  TTP to L infraspinatus, L upper trap   TREATMENT: [facility] Adult PT Treatment:                                                DATE: 04/01/23 Therapeutic Exercise: Row 2x10 13# Shoulder ext 2x10 blue band Seated bilateral ER 2x10 RTB Seated horizontal abd 3x10  RTB L shoulder IR/ER 2x10 RTB Lifting 2# DB into cabinet bottom/middle shelf 2x10 LUE flexion Shoulder ext 2x10 blue band Supine D2 flexion 2x10 L YTB Supine serratus raise 2x10 YTB Therapeutic Activity: Assessment of tests/measures, goals, and outcomes  [facility] Adult PT Treatment:                                                DATE: 02/03/23 Therapeutic Exercise: UBE level 1.0 2'/2' fwd/bwd Lifting 2# DB into cabinet bottom/middle shelf 2x10 LUE flexion Rows 17# 2x10 Shoulder extension 13# 2x10 ER/IR 3# x10 each LUE Seated double ER with scap retraction RTB 2x10 Seated horizontal abduction 2x10 RTB Seated diagonals RTB x10 BIL Seated upper trap stretch 2x30" BIL   [facility] Adult PT Treatment:                                                DATE: 01/28/23 Therapeutic Exercise: UBE level 1.0 2'/2' fwd/bwd Lifting 1# DB into cabinet bottom/middle shelf x10 LUE flexion, 2# x10 Doorway pec stretch 2x30" Rows 13# x10, 17# x10 Shoulder extension 10# 2x10 ER/IR 3# x10 each LUE Seated double ER with scap retraction RTB 2x10 Seated horizontal abduction 2x10 RTB Seated diagonals RTB x10 BIL Seated upper trap stretch 2x30" BIL Seated levator scap stretch 2x30" BIL   [facility] Adult PT Treatment:  DATE: 01/22/2023 Therapeutic Exercise: L upper trap stretch x 30" Row GTB x 10 R shoulder ER isometric x 5 - 5" hold Seated scapular retractions x 5  PATIENT EDUCATION: Education details: continue HEP Person educated: Patient Education method: Explanation, Demonstration, and Handouts Education comprehension: verbalized understanding and returned demonstration  HOME EXERCISE PROGRAM: Access Code: [email or web address] URL: [email or web address] Date: 02/03/2023 Prepared by: Berta Minor  Exercises - Seated Upper Trapezius Stretch (Mirrored)  - 1 x daily - 7 x weekly - 2 reps - 30 sec hold - Standing Shoulder Row with Anchored Resistance  - 1 x  daily - 7 x weekly - 3 sets - 10 reps - gree band hold - Standing Isometric Shoulder External Rotation with Doorway and Towel Roll  - 1 x daily - 7 x weekly - 2-3 sets - 10 reps - 5 sec hold - Shoulder External Rotation and Scapular Retraction with Resistance  - 1 x daily - 7 x weekly - 3 sets - 10 reps - Standing Shoulder Horizontal Abduction with Resistance  - 1 x daily - 7 x weekly - 3 sets - 10 reps - Standing Shoulder Diagonal Horizontal Abduction 60/120 Degrees with Resistance  - 1 x daily - 7 x weekly - 2 sets - 10 reps  ASSESSMENT:  CLINICAL IMPRESSION: Pt presented to PT for re-evaluation to restart episode of care for continued L shoulder pain. Continues to have good ROM for L shoulder WFL, but is lacking L shoulder strength into IR/ER. She continues to benefit from skilled PT services working on dynamic stabilization of L shoulder. Will continue to progress as able per POC.    OBJECTIVE IMPAIRMENTS: decreased activity tolerance, decreased mobility, decreased ROM, decreased strength, impaired UE functional use, and pain.   ACTIVITY LIMITATIONS: carrying, lifting, and reach over head  PARTICIPATION LIMITATIONS: driving, shopping, community activity, occupation, and yard work  PERSONAL FACTORS: Time since onset of injury/illness/exacerbation and 1-2 comorbidities: HTN  are also affecting patient's functional outcome.   REHAB POTENTIAL: Excellent  CLINICAL DECISION MAKING: Stable/uncomplicated  EVALUATION COMPLEXITY: Low   GOALS: Goals reviewed with patient? No  SHORT TERM GOALS: Target date: 02/11/2023   Pt will be compliant and knowledgeable with initial HEP for improved comfort and carryover Baseline: initial HEP given  Goal status: INITIAL  2.  Pt will self report left shoulder pain no greater than 3/10 for improved comfort and functional ability Baseline: 5/10 at worst Goal status: INITIAL   LONG TERM GOALS: Target date: 05/13/2023    Pt will improve FOTO function  score to no less than 80% as proxy for functional improvement with home ADLs and community activities  Baseline: 74% function 04/01/2023: 57% function Goal status: INITIAL   2.  Pt will self report left shoulder pain no greater than 1/10 for improved comfort and functional ability Baseline: 5/10 at worst Goal status: INITIAL   3.  Pt will improve left shoulder flexion/abd/IR to WNL without hiking compensation or pain through range for improved comfort and functional ability with home ADLs and community activity Baseline: See ROM chart Goal status: INITIAL  4.  Pt will improve left shoulder IR/ER strength to no less than 5/5 for improved dynamic stabilization with OH motions Baseline: see MMT chart Goal status: INITIAL  5.  Pt will be able to lift 25# OH to cabinet at shoulder height for improved functional ability and potentially get back to working part time Baseline: unable Goal status: INITIAL   PLAN:  PT FREQUENCY: 1x/week  PT  DURATION: 6 weeks  PLANNED INTERVENTIONS: Therapeutic exercises, Therapeutic activity, Neuromuscular re-education, Balance training, Gait training, Patient/Family education, Self Care, Joint mobilization, Dry Needling, Electrical stimulation, Cryotherapy, Moist heat, Vasopneumatic device, Manual therapy, and Re-evaluation  PLAN FOR NEXT SESSION: assess HEP response, pec minor and upper trap stretching, RTC and periscapular strengthening   Eloy End, PT 04/01/2023, 9:44 AM

## 2023-04-14 ENCOUNTER — Ambulatory Visit: Payer: 59 | Attending: Physician Assistant

## 2023-04-14 DIAGNOSIS — M6281 Muscle weakness (generalized): Secondary | ICD-10-CM | POA: Insufficient documentation

## 2023-04-14 DIAGNOSIS — M25512 Pain in left shoulder: Secondary | ICD-10-CM | POA: Diagnosis present

## 2023-04-14 DIAGNOSIS — G8929 Other chronic pain: Secondary | ICD-10-CM | POA: Insufficient documentation

## 2023-04-14 NOTE — Therapy (Signed)
OUTPATIENT PHYSICAL THERAPY TREATMENT NOTE   Patient Name: Amy Baird MRN: [id number] DOB:22-Jul-1956, 67 y.o., female Today's Date: 04/14/2023  END OF SESSION:  PT End of Session - 04/14/23 0921     Visit Number 5    Number of Visits 17    Date for PT Re-Evaluation 05/13/23    Authorization Type [employer] MCR and MCD    PT Start Time 0930    PT Stop Time 1010    PT Time Calculation (min) 40 min    Activity Tolerance Patient tolerated treatment well    Behavior During Therapy WFL for tasks assessed/performed                 Past Medical History:  Diagnosis Date   Hard of hearing    Hypertension    History reviewed. No pertinent surgical history. There are no problems to display for this patient.   PCP: Barbarann Ehlers   REFERRING PROVIDER: Barbarann Ehlers   REFERRING DIAG:  T55.732 (ICD-10-CM) - Pain in left shoulder G89.29 (ICD-10-CM) - Other chronic pain  THERAPY DIAG:  Chronic left shoulder pain  Muscle weakness (generalized)  Rationale for Evaluation and Treatment: Rehabilitation  ONSET DATE: Chronic - November 2022  SUBJECTIVE:                                                                                                                                                                                      SUBJECTIVE STATEMENT: Pt presents to PT with continued L shoulder pain, although it continues not to bother her too much.   Hand dominance: Right  PERTINENT HISTORY: HTN  PAIN:  Are you having pain?  No: NPRS scale: 0/10 Worst: 8/10 Pain location: L shoulder, L upper trap Pain description: sharp, tight Aggravating factors: reaching OH, lifting  Relieving factors: heat  PRECAUTIONS: None  WEIGHT BEARING RESTRICTIONS: No  FALLS:  Has patient fallen in last 6 months?2 No  LIVING ENVIRONMENT: Lives with: lives with their family Lives in: House/apartment  OCCUPATION: Not currently working (would like to get  back)  PLOF: Independent  PATIENT GOALS: decrease shoulder pain, improve lifting, get back to working a part time job  OBJECTIVE:   DIAGNOSTIC FINDINGS:  N/A  PATIENT SURVEYS:  FOTO: 74% function; 66% predicted 04/01/2023: 57% function  COGNITION: Overall cognitive status: Within functional limits for tasks assessed     SENSATION: WFL  POSTURE: Rounded shoulders, fwd head  UPPER EXTREMITY ROM:   Active ROM Right eval Left eval  Shoulder flexion WNL WNL (with pain and hiking)  Shoulder extension    Shoulder abduction WNL WNL (with pain and hiking)  Shoulder  adduction    Shoulder internal rotation WNL WNL with pain  Shoulder external rotation WNL WNL  Elbow flexion    Elbow extension    Wrist flexion    Wrist extension    Wrist ulnar deviation    Wrist radial deviation    Wrist pronation    Wrist supination    (Blank rows = not tested)  UPPER EXTREMITY MMT:  MMT Right eval Left eval Left 04/01/2023  Shoulder flexion 5/5 5/5   Shoulder extension     Shoulder abduction 5/5 5/5   Shoulder adduction     Shoulder internal rotation 5/5 4/5   Shoulder external rotation 5/5 3+/5 with pain   Middle trapezius     Lower trapezius     Elbow flexion     Elbow extension     Wrist flexion     Wrist extension     Wrist ulnar deviation     Wrist radial deviation     Wrist pronation     Wrist supination     Grip strength (lbs)     (Blank rows = not tested)  SHOULDER SPECIAL TESTS: Impingement tests: Neer impingement test: negative and Painful arc test: positive  SLAP lesions: DNT Instability tests: DNT Rotator cuff assessment: DNT Biceps assessment: DNT  JOINT MOBILITY TESTING:  WNL  PALPATION:  TTP to L infraspinatus, L upper trap   TREATMENT: [facility] Adult PT Treatment:                                                DATE: 04/14/23 Therapeutic Exercise: UBE lvl 1.0 x 4 min while taking subjective Row 2x10 17# Shoulder ext 2x10 17# Seated bilateral ER  2x15 GTB Seated horizontal abd 2x10 GTB L shoulder IR/ER 3x10 RTB Supine D2 flexion 2x10 L YTB Supine serratus raise 2x10 YTB Supine serratus punch 2x10 2# L Supine physioball flexion 2x10 L shoulder shelf taps flexion 2x10 2#  [facility] Adult PT Treatment:                                                DATE: 04/01/23 Therapeutic Exercise: Row 2x10 13# Shoulder ext 2x10 blue band Seated bilateral ER 2x10 RTB Seated horizontal abd 3x10 RTB L shoulder IR/ER 2x10 RTB Lifting 2# DB into cabinet bottom/middle shelf 2x10 LUE flexion Shoulder ext 2x10 blue band Supine D2 flexion 2x10 L YTB Supine serratus raise 2x10 YTB Therapeutic Activity: Assessment of tests/measures, goals, and outcomes  [facility] Adult PT Treatment:                                                DATE: 02/03/23 Therapeutic Exercise: UBE level 1.0 2'/2' fwd/bwd Lifting 2# DB into cabinet bottom/middle shelf 2x10 LUE flexion Rows 17# 2x10 Shoulder extension 13# 2x10 ER/IR 3# x10 each LUE Seated double ER with scap retraction RTB 2x10 Seated horizontal abduction 2x10 RTB Seated diagonals RTB x10 BIL Seated upper trap stretch 2x30" BIL   PATIENT EDUCATION: Education details: HEP update Person educated: Patient Education method: Explanation, Demonstration, and Handouts Education comprehension: verbalized understanding and returned demonstration  HOME EXERCISE PROGRAM: Access Code: [email or web address] URL: [email or web address] Date: 04/14/2023 Prepared by: Edwinna Areola  Exercises - Seated Upper Trapezius Stretch (Mirrored)  - 1 x daily - 7 x weekly - 2 reps - 30 sec hold - Standing Shoulder Row with Anchored Resistance  - 1 x daily - 7 x weekly - 3 sets - 10 reps - gree band hold - Standing Isometric Shoulder External Rotation with Doorway and Towel Roll  - 1 x daily - 7 x weekly - 2-3 sets - 10 reps - 5 sec hold - Shoulder External Rotation and Scapular Retraction with Resistance  - 1 x daily - 7 x weekly - 3 sets  - 10 reps - Standing Shoulder Horizontal Abduction with Resistance  - 1 x daily - 7 x weekly - 3 sets - 10 reps - Standing Shoulder Diagonal Horizontal Abduction 60/120 Degrees with Resistance  - 1 x daily - 7 x weekly - 2 sets - 10 reps - Shoulder External Rotation with Anchored Resistance  - 1 x daily - 7 x weekly - 3 sets - 10 reps - red band hold - Shoulder Internal Rotation with Resistance  - 1 x daily - 7 x weekly - 3 sets - 10 reps - red band hold  ASSESSMENT:  CLINICAL IMPRESSION: Pt was able to complete all prescribed exercises with no adverse effect. Therapy focused on improving dynamic shoulder stabilizer strength and stability. HEP updated with RTC strengthening exercises. Will continue to progress exercises as able per POC.    OBJECTIVE IMPAIRMENTS: decreased activity tolerance, decreased mobility, decreased ROM, decreased strength, impaired UE functional use, and pain.   ACTIVITY LIMITATIONS: carrying, lifting, and reach over head  PARTICIPATION LIMITATIONS: driving, shopping, community activity, occupation, and yard work  PERSONAL FACTORS: Time since onset of injury/illness/exacerbation and 1-2 comorbidities: HTN  are also affecting patient's functional outcome.    GOALS: Goals reviewed with patient? No  SHORT TERM GOALS: Target date: 02/11/2023   Pt will be compliant and knowledgeable with initial HEP for improved comfort and carryover Baseline: initial HEP given  Goal status: INITIAL  2.  Pt will Baird report left shoulder pain no greater than 3/10 for improved comfort and functional ability Baseline: 5/10 at worst Goal status: INITIAL   LONG TERM GOALS: Target date: 05/13/2023    Pt will improve FOTO function score to no less than 80% as proxy for functional improvement with home ADLs and community activities  Baseline: 74% function 04/01/2023: 57% function Goal status: INITIAL   2.  Pt will Baird report left shoulder pain no greater than 1/10 for improved  comfort and functional ability Baseline: 5/10 at worst Goal status: INITIAL   3.  Pt will improve left shoulder flexion/abd/IR to WNL without hiking compensation or pain through range for improved comfort and functional ability with home ADLs and community activity Baseline: See ROM chart Goal status: INITIAL  4.  Pt will improve left shoulder IR/ER strength to no less than 5/5 for improved dynamic stabilization with OH motions Baseline: see MMT chart Goal status: INITIAL  5.  Pt will be able to lift 25# OH to cabinet at shoulder height for improved functional ability and potentially get back to working part time Baseline: unable Goal status: INITIAL   PLAN:  PT FREQUENCY: 1x/week  PT DURATION: 6 weeks  PLANNED INTERVENTIONS: Therapeutic exercises, Therapeutic activity, Neuromuscular re-education, Balance training, Gait training, Patient/Family education, Baird Care, Joint mobilization, Dry Needling, Electrical stimulation, Cryotherapy, Moist heat, Vasopneumatic device, Manual therapy,  and Re-evaluation  PLAN FOR NEXT SESSION: assess HEP response, pec minor and upper trap stretching, RTC and periscapular strengthening   Eloy End, PT 04/14/2023, 10:13 AM

## 2023-04-20 NOTE — Therapy (Signed)
OUTPATIENT PHYSICAL THERAPY TREATMENT NOTE   Patient Name: Amy Baird MRN: [id number] DOB:09/01/1955, 67 y.o., female Today's Date: 04/21/2023  END OF SESSION:  PT End of Session - 04/21/23 0932     Visit Number 6    Number of Visits 17    Date for PT Re-Evaluation 05/13/23    Authorization Type [employer] MCR and MCD    PT Start Time 0932    PT Stop Time 1010    PT Time Calculation (min) 38 min    Activity Tolerance Patient tolerated treatment well    Behavior During Therapy WFL for tasks assessed/performed                  Past Medical History:  Diagnosis Date   Hard of hearing    Hypertension    History reviewed. No pertinent surgical history. There are no problems to display for this patient.   PCP: Barbarann Ehlers   REFERRING PROVIDER: Barbarann Ehlers   REFERRING DIAG:  N56.213 (ICD-10-CM) - Pain in left shoulder G89.29 (ICD-10-CM) - Other chronic pain  THERAPY DIAG:  Chronic left shoulder pain  Muscle weakness (generalized)  Rationale for Evaluation and Treatment: Rehabilitation  ONSET DATE: Chronic - November 2022  SUBJECTIVE:                                                                                                                                                                                      SUBJECTIVE STATEMENT: Pt presents to PT with continued improvement in L shoulder noted. Has been compliant with HEP.   Hand dominance: Right  PERTINENT HISTORY: HTN  PAIN:  Are you having pain?  No: NPRS scale: 0/10 Worst: 8/10 Pain location: L shoulder, L upper trap Pain description: sharp, tight Aggravating factors: reaching OH, lifting  Relieving factors: heat  PRECAUTIONS: None  WEIGHT BEARING RESTRICTIONS: No  FALLS:  Has patient fallen in last 6 months?2 No  LIVING ENVIRONMENT: Lives with: lives with their family Lives in: House/apartment  OCCUPATION: Not currently working (would like to get back)  PLOF:  Independent  PATIENT GOALS: decrease shoulder pain, improve lifting, get back to working a part time job  OBJECTIVE:   DIAGNOSTIC FINDINGS:  N/A  PATIENT SURVEYS:  FOTO: 74% function; 66% predicted 04/01/2023: 57% function  COGNITION: Overall cognitive status: Within functional limits for tasks assessed     SENSATION: WFL  POSTURE: Rounded shoulders, fwd head  UPPER EXTREMITY ROM:   Active ROM Right eval Left eval  Shoulder flexion WNL WNL (with pain and hiking)  Shoulder extension    Shoulder abduction WNL WNL (with pain and hiking)  Shoulder adduction  Shoulder internal rotation WNL WNL with pain  Shoulder external rotation WNL WNL  Elbow flexion    Elbow extension    Wrist flexion    Wrist extension    Wrist ulnar deviation    Wrist radial deviation    Wrist pronation    Wrist supination    (Blank rows = not tested)  UPPER EXTREMITY MMT:  MMT Right eval Left eval Left 04/01/2023  Shoulder flexion 5/5 5/5   Shoulder extension     Shoulder abduction 5/5 5/5   Shoulder adduction     Shoulder internal rotation 5/5 4/5   Shoulder external rotation 5/5 3+/5 with pain   Middle trapezius     Lower trapezius     Elbow flexion     Elbow extension     Wrist flexion     Wrist extension     Wrist ulnar deviation     Wrist radial deviation     Wrist pronation     Wrist supination     Grip strength (lbs)     (Blank rows = not tested)  SHOULDER SPECIAL TESTS: Impingement tests: Neer impingement test: negative and Painful arc test: positive  SLAP lesions: DNT Instability tests: DNT Rotator cuff assessment: DNT Biceps assessment: DNT  JOINT MOBILITY TESTING:  WNL  PALPATION:  TTP to L infraspinatus, L upper trap   TREATMENT: [facility] Adult PT Treatment:                                                DATE: 04/21/23 Therapeutic Exercise: UBE lvl 1.0 x 4 min while taking subjective Row 2x12 17# Shoulder ext 2x10 17# L shoulder ER 2x15 RTB Serratus wall  slide with soft foam 2x10 YTB L shoulder abd wall slide 2x10 Supine serratus raise 2x10 YTB Supine D2 flex 2x10 RTB L Supine horizontal abd 2x15 GTB L shoulder shelf taps flexion 2x10 2#  [facility] Adult PT Treatment:                                                DATE: 04/14/23 Therapeutic Exercise: UBE lvl 1.0 x 4 min while taking subjective Row 2x10 17# Shoulder ext 2x10 17# Seated bilateral ER 2x15 GTB Seated horizontal abd 2x10 GTB L shoulder IR/ER 3x10 RTB Supine D2 flexion 2x10 L YTB Supine serratus raise 2x10 YTB Supine serratus punch 2x10 2# L Supine physioball flexion 2x10 L shoulder shelf taps flexion 2x10 2#  [facility] Adult PT Treatment:                                                DATE: 04/01/23 Therapeutic Exercise: Row 2x10 13# Shoulder ext 2x10 blue band Seated bilateral ER 2x10 RTB Seated horizontal abd 3x10 RTB L shoulder IR/ER 2x10 RTB Lifting 2# DB into cabinet bottom/middle shelf 2x10 LUE flexion Shoulder ext 2x10 blue band Supine D2 flexion 2x10 L YTB Supine serratus raise 2x10 YTB Therapeutic Activity: Assessment of tests/measures, goals, and outcomes  [facility] Adult PT Treatment:  DATE: 02/03/23 Therapeutic Exercise: UBE level 1.0 2'/2' fwd/bwd Lifting 2# DB into cabinet bottom/middle shelf 2x10 LUE flexion Rows 17# 2x10 Shoulder extension 13# 2x10 ER/IR 3# x10 each LUE Seated double ER with scap retraction RTB 2x10 Seated horizontal abduction 2x10 RTB Seated diagonals RTB x10 BIL Seated upper trap stretch 2x30" BIL   PATIENT EDUCATION: Education details: HEP update Person educated: Patient Education method: Explanation, Demonstration, and Handouts Education comprehension: verbalized understanding and returned demonstration  HOME EXERCISE PROGRAM: Access Code: [email or web address] URL: [email or web address] Date: 04/14/2023 Prepared by: Edwinna Areola  Exercises - Seated Upper Trapezius Stretch  (Mirrored)  - 1 x daily - 7 x weekly - 2 reps - 30 sec hold - Standing Shoulder Row with Anchored Resistance  - 1 x daily - 7 x weekly - 3 sets - 10 reps - gree band hold - Standing Isometric Shoulder External Rotation with Doorway and Towel Roll  - 1 x daily - 7 x weekly - 2-3 sets - 10 reps - 5 sec hold - Shoulder External Rotation and Scapular Retraction with Resistance  - 1 x daily - 7 x weekly - 3 sets - 10 reps - Standing Shoulder Horizontal Abduction with Resistance  - 1 x daily - 7 x weekly - 3 sets - 10 reps - Standing Shoulder Diagonal Horizontal Abduction 60/120 Degrees with Resistance  - 1 x daily - 7 x weekly - 2 sets - 10 reps - Shoulder External Rotation with Anchored Resistance  - 1 x daily - 7 x weekly - 3 sets - 10 reps - red band hold - Shoulder Internal Rotation with Resistance  - 1 x daily - 7 x weekly - 3 sets - 10 reps - red band hold  ASSESSMENT:  CLINICAL IMPRESSION: Pt was able to complete all prescribed exercises with no adverse effect. Therapy focused on improving dynamic shoulder stabilizer strength and stability. HEP updated with RTC strengthening exercises. Will continue to progress exercises as able per POC.    OBJECTIVE IMPAIRMENTS: decreased activity tolerance, decreased mobility, decreased ROM, decreased strength, impaired UE functional use, and pain.   ACTIVITY LIMITATIONS: carrying, lifting, and reach over head  PARTICIPATION LIMITATIONS: driving, shopping, community activity, occupation, and yard work  PERSONAL FACTORS: Time since onset of injury/illness/exacerbation and 1-2 comorbidities: HTN  are also affecting patient's functional outcome.    GOALS: Goals reviewed with patient? No  SHORT TERM GOALS: Target date: 02/11/2023   Pt will be compliant and knowledgeable with initial HEP for improved comfort and carryover Baseline: initial HEP given  Goal status: INITIAL  2.  Pt will self report left shoulder pain no greater than 3/10 for improved  comfort and functional ability Baseline: 5/10 at worst Goal status: INITIAL   LONG TERM GOALS: Target date: 05/13/2023    Pt will improve FOTO function score to no less than 80% as proxy for functional improvement with home ADLs and community activities  Baseline: 74% function 04/01/2023: 57% function Goal status: INITIAL   2.  Pt will self report left shoulder pain no greater than 1/10 for improved comfort and functional ability Baseline: 5/10 at worst Goal status: INITIAL   3.  Pt will improve left shoulder flexion/abd/IR to WNL without hiking compensation or pain through range for improved comfort and functional ability with home ADLs and community activity Baseline: See ROM chart Goal status: INITIAL  4.  Pt will improve left shoulder IR/ER strength to no less than 5/5 for improved dynamic stabilization with OH motions Baseline: see  MMT chart Goal status: INITIAL  5.  Pt will be able to lift 25# OH to cabinet at shoulder height for improved functional ability and potentially get back to working part time Baseline: unable Goal status: INITIAL   PLAN:  PT FREQUENCY: 1x/week  PT DURATION: 6 weeks  PLANNED INTERVENTIONS: Therapeutic exercises, Therapeutic activity, Neuromuscular re-education, Balance training, Gait training, Patient/Family education, Self Care, Joint mobilization, Dry Needling, Electrical stimulation, Cryotherapy, Moist heat, Vasopneumatic device, Manual therapy, and Re-evaluation  PLAN FOR NEXT SESSION: assess HEP response, pec minor and upper trap stretching, RTC and periscapular strengthening   Eloy End, PT 04/21/2023, 10:12 AM

## 2023-04-21 ENCOUNTER — Ambulatory Visit: Payer: 59

## 2023-04-21 DIAGNOSIS — M6281 Muscle weakness (generalized): Secondary | ICD-10-CM

## 2023-04-21 DIAGNOSIS — G8929 Other chronic pain: Secondary | ICD-10-CM

## 2023-04-21 DIAGNOSIS — M25512 Pain in left shoulder: Secondary | ICD-10-CM | POA: Diagnosis not present

## 2023-04-28 ENCOUNTER — Ambulatory Visit: Payer: 59

## 2023-04-28 DIAGNOSIS — M6281 Muscle weakness (generalized): Secondary | ICD-10-CM

## 2023-04-28 DIAGNOSIS — M25512 Pain in left shoulder: Secondary | ICD-10-CM | POA: Diagnosis not present

## 2023-04-28 DIAGNOSIS — G8929 Other chronic pain: Secondary | ICD-10-CM

## 2023-04-28 NOTE — Therapy (Signed)
OUTPATIENT PHYSICAL THERAPY TREATMENT NOTE   Patient Name: Amy Baird MRN: [id number] DOB:15-Feb-1956, 67 y.o., female Today's Date: 04/28/2023  END OF SESSION:  PT End of Session - 04/28/23 0932     Visit Number 7    Number of Visits 17    Date for PT Re-Evaluation 05/13/23    Authorization Type [employer] MCR and MCD    PT Start Time 0932    PT Stop Time 1010    PT Time Calculation (min) 38 min    Activity Tolerance Patient tolerated treatment well    Behavior During Therapy WFL for tasks assessed/performed                   Past Medical History:  Diagnosis Date   Hard of hearing    Hypertension    History reviewed. No pertinent surgical history. There are no problems to display for this patient.   PCP: Barbarann Ehlers   REFERRING PROVIDER: Barbarann Ehlers   REFERRING DIAG:  Y86.578 (ICD-10-CM) - Pain in left shoulder G89.29 (ICD-10-CM) - Other chronic pain  THERAPY DIAG:  Chronic left shoulder pain  Muscle weakness (generalized)  Rationale for Evaluation and Treatment: Rehabilitation  ONSET DATE: Chronic - November 2022  SUBJECTIVE:                                                                                                                                                                                      SUBJECTIVE STATEMENT: Pt presents to PT with reports of increased L shoulder pain over the weekend. Is unsure of any cause, feels deep in the muscle. Would like to see about getting another steroid injection.   Hand dominance: Right  PERTINENT HISTORY: HTN  PAIN:  Are you having pain?  No: NPRS scale: 5/10 Worst: 8/10 Pain location: L shoulder, L upper trap Pain description: sharp, tight Aggravating factors: reaching OH, lifting  Relieving factors: heat  PRECAUTIONS: None  WEIGHT BEARING RESTRICTIONS: No  FALLS:  Has patient fallen in last 6 months?2 No  LIVING ENVIRONMENT: Lives with: lives with their  family Lives in: House/apartment  OCCUPATION: Not currently working (would like to get back)  PLOF: Independent  PATIENT GOALS: decrease shoulder pain, improve lifting, get back to working a part time job  OBJECTIVE:   DIAGNOSTIC FINDINGS:  N/A  PATIENT SURVEYS:  FOTO: 74% function; 66% predicted 04/01/2023: 57% function  COGNITION: Overall cognitive status: Within functional limits for tasks assessed     SENSATION: WFL  POSTURE: Rounded shoulders, fwd head  UPPER EXTREMITY ROM:   Active ROM Right eval Left eval  Shoulder flexion WNL WNL (with pain and  hiking)  Shoulder extension    Shoulder abduction WNL WNL (with pain and hiking)  Shoulder adduction    Shoulder internal rotation WNL WNL with pain  Shoulder external rotation WNL WNL  Elbow flexion    Elbow extension    Wrist flexion    Wrist extension    Wrist ulnar deviation    Wrist radial deviation    Wrist pronation    Wrist supination    (Blank rows = not tested)  UPPER EXTREMITY MMT:  MMT Right eval Left eval Left 04/01/2023  Shoulder flexion 5/5 5/5   Shoulder extension     Shoulder abduction 5/5 5/5   Shoulder adduction     Shoulder internal rotation 5/5 4/5   Shoulder external rotation 5/5 3+/5 with pain   Middle trapezius     Lower trapezius     Elbow flexion     Elbow extension     Wrist flexion     Wrist extension     Wrist ulnar deviation     Wrist radial deviation     Wrist pronation     Wrist supination     Grip strength (lbs)     (Blank rows = not tested)  SHOULDER SPECIAL TESTS: Impingement tests: Neer impingement test: negative and Painful arc test: positive  SLAP lesions: DNT Instability tests: DNT Rotator cuff assessment: DNT Biceps assessment: DNT  JOINT MOBILITY TESTING:  WNL  PALPATION:  TTP to L infraspinatus, L upper trap   TREATMENT: [facility] Adult PT Treatment:                                                DATE: 04/28/23 Therapeutic Exercise: UBE lvl 1.0 x  4 min while taking subjective Row 3x10 13# Shoulder ext 2x10 13# L shoulder ER 2x10 RTB L shoulder IR 2x10 GTB Seated bilateral ER 2x10 RTB Seated horizontal abd 3x10 RTB Serratus wall slide with soft foam 2x10 YTB Supine serratus raise 2x10 YTB Supine D2 flex 2x10 RTB L Sidelying shoulder abd 2x10 2# L Sidelying external rotation 2x10 2# L  [facility] Adult PT Treatment:                                                DATE: 04/21/23 Therapeutic Exercise: UBE lvl 1.0 x 4 min while taking subjective Row 2x12 17# Shoulder ext 2x10 17# L shoulder ER 2x15 RTB Serratus wall slide with soft foam 2x10 YTB L shoulder abd wall slide 2x10 Supine serratus raise 2x10 YTB Supine D2 flex 2x10 RTB L Supine horizontal abd 2x15 GTB L shoulder shelf taps flexion 2x10 2#  [facility] Adult PT Treatment:                                                DATE: 04/14/23 Therapeutic Exercise: UBE lvl 1.0 x 4 min while taking subjective Row 2x10 17# Shoulder ext 2x10 17# Seated bilateral ER 2x15 GTB Seated horizontal abd 2x10 GTB L shoulder IR/ER 3x10 RTB Supine D2 flexion 2x10 L YTB Supine serratus raise 2x10 YTB Supine serratus punch 2x10 2# L Supine physioball  flexion 2x10 L shoulder shelf taps flexion 2x10 2#  [facility] Adult PT Treatment:                                                DATE: 04/01/23 Therapeutic Exercise: Row 2x10 13# Shoulder ext 2x10 blue band Seated bilateral ER 2x10 RTB Seated horizontal abd 3x10 RTB L shoulder IR/ER 2x10 RTB Lifting 2# DB into cabinet bottom/middle shelf 2x10 LUE flexion Shoulder ext 2x10 blue band Supine D2 flexion 2x10 L YTB Supine serratus raise 2x10 YTB Therapeutic Activity: Assessment of tests/measures, goals, and outcomes  [facility] Adult PT Treatment:                                                DATE: 02/03/23 Therapeutic Exercise: UBE level 1.0 2'/2' fwd/bwd Lifting 2# DB into cabinet bottom/middle shelf 2x10 LUE flexion Rows 17# 2x10 Shoulder extension 13#  2x10 ER/IR 3# x10 each LUE Seated double ER with scap retraction RTB 2x10 Seated horizontal abduction 2x10 RTB Seated diagonals RTB x10 BIL Seated upper trap stretch 2x30" BIL   PATIENT EDUCATION: Education details: HEP update Person educated: Patient Education method: Explanation, Demonstration, and Handouts Education comprehension: verbalized understanding and returned demonstration  HOME EXERCISE PROGRAM: Access Code: [email or web address] URL: [email or web address] Date: 04/14/2023 Prepared by: Edwinna Areola  Exercises - Seated Upper Trapezius Stretch (Mirrored)  - 1 x daily - 7 x weekly - 2 reps - 30 sec hold - Standing Shoulder Row with Anchored Resistance  - 1 x daily - 7 x weekly - 3 sets - 10 reps - gree band hold - Standing Isometric Shoulder External Rotation with Doorway and Towel Roll  - 1 x daily - 7 x weekly - 2-3 sets - 10 reps - 5 sec hold - Shoulder External Rotation and Scapular Retraction with Resistance  - 1 x daily - 7 x weekly - 3 sets - 10 reps - Standing Shoulder Horizontal Abduction with Resistance  - 1 x daily - 7 x weekly - 3 sets - 10 reps - Standing Shoulder Diagonal Horizontal Abduction 60/120 Degrees with Resistance  - 1 x daily - 7 x weekly - 2 sets - 10 reps - Shoulder External Rotation with Anchored Resistance  - 1 x daily - 7 x weekly - 3 sets - 10 reps - red band hold - Shoulder Internal Rotation with Resistance  - 1 x daily - 7 x weekly - 3 sets - 10 reps - red band hold  ASSESSMENT:  CLINICAL IMPRESSION: Pt was able to complete all prescribed exercises with no adverse effect despite coming in with increased pain. Therapy focused on improving dynamic shoulder stabilizer strength and stability with RTC and periscapular strengthening. Will continue to progress exercises as able per POC.    OBJECTIVE IMPAIRMENTS: decreased activity tolerance, decreased mobility, decreased ROM, decreased strength, impaired UE functional use, and pain.   ACTIVITY  LIMITATIONS: carrying, lifting, and reach over head  PARTICIPATION LIMITATIONS: driving, shopping, community activity, occupation, and yard work  PERSONAL FACTORS: Time since onset of injury/illness/exacerbation and 1-2 comorbidities: HTN  are also affecting patient's functional outcome.    GOALS: Goals reviewed with patient? No  SHORT TERM GOALS: Target date: 02/11/2023   Pt will be  compliant and knowledgeable with initial HEP for improved comfort and carryover Baseline: initial HEP given  Goal status: INITIAL  2.  Pt will self report left shoulder pain no greater than 3/10 for improved comfort and functional ability Baseline: 5/10 at worst Goal status: INITIAL   LONG TERM GOALS: Target date: 05/13/2023    Pt will improve FOTO function score to no less than 80% as proxy for functional improvement with home ADLs and community activities  Baseline: 74% function 04/01/2023: 57% function Goal status: INITIAL   2.  Pt will self report left shoulder pain no greater than 1/10 for improved comfort and functional ability Baseline: 5/10 at worst Goal status: INITIAL   3.  Pt will improve left shoulder flexion/abd/IR to WNL without hiking compensation or pain through range for improved comfort and functional ability with home ADLs and community activity Baseline: See ROM chart Goal status: INITIAL  4.  Pt will improve left shoulder IR/ER strength to no less than 5/5 for improved dynamic stabilization with OH motions Baseline: see MMT chart Goal status: INITIAL  5.  Pt will be able to lift 25# OH to cabinet at shoulder height for improved functional ability and potentially get back to working part time Baseline: unable Goal status: INITIAL   PLAN:  PT FREQUENCY: 1x/week  PT DURATION: 6 weeks  PLANNED INTERVENTIONS: Therapeutic exercises, Therapeutic activity, Neuromuscular re-education, Balance training, Gait training, Patient/Family education, Self Care, Joint mobilization, Dry  Needling, Electrical stimulation, Cryotherapy, Moist heat, Vasopneumatic device, Manual therapy, and Re-evaluation  PLAN FOR NEXT SESSION: assess HEP response, pec minor and upper trap stretching, RTC and periscapular strengthening   Eloy End, PT 04/28/2023, 10:11 AM

## 2023-05-05 ENCOUNTER — Ambulatory Visit: Payer: 59 | Attending: Physician Assistant

## 2023-05-05 DIAGNOSIS — G8929 Other chronic pain: Secondary | ICD-10-CM | POA: Insufficient documentation

## 2023-05-05 DIAGNOSIS — M6281 Muscle weakness (generalized): Secondary | ICD-10-CM | POA: Insufficient documentation

## 2023-05-05 DIAGNOSIS — M25512 Pain in left shoulder: Secondary | ICD-10-CM | POA: Insufficient documentation

## 2023-05-05 NOTE — Therapy (Signed)
OUTPATIENT PHYSICAL THERAPY TREATMENT NOTE/DISCHARGE   Patient Name: Amy Baird MRN: [id number] DOB:[birth year], 67 y.o., female Today's Date: 05/05/2023  END OF SESSION:  PT End of Session - 05/05/23 0924     Visit Number 8    Number of Visits 17    Date for PT Re-Evaluation 05/13/23    Authorization Type [employer] MCR and MCD    PT Start Time 0930    PT Stop Time 1008    PT Time Calculation (min) 38 min    Activity Tolerance Patient tolerated treatment well    Behavior During Therapy WFL for tasks assessed/performed                    Past Medical History:  Diagnosis Date   Hard of hearing    Hypertension    History reviewed. No pertinent surgical history. There are no problems to display for this patient.   PCP: Barbarann Ehlers   REFERRING PROVIDER: Barbarann Ehlers   REFERRING DIAG:  L38.101 (ICD-10-CM) - Pain in left shoulder G89.29 (ICD-10-CM) - Other chronic pain  THERAPY DIAG:  Chronic left shoulder pain  Muscle weakness (generalized)  Rationale for Evaluation and Treatment: Rehabilitation  ONSET DATE: Chronic - November 2022  SUBJECTIVE:                                                                                                                                                                                      SUBJECTIVE STATEMENT: Pt presents to PT with reports of continued occasional pain in L shoulder. Is getting an injection tomorrow. Ready to begin PT at this time.   Hand dominance: Right  PERTINENT HISTORY: HTN  PAIN:  Are you having pain?  No: NPRS scale: 5/10 Worst: 8/10 Pain location: L shoulder, L upper trap Pain description: sharp, tight Aggravating factors: reaching OH, lifting  Relieving factors: heat  PRECAUTIONS: None  WEIGHT BEARING RESTRICTIONS: No  FALLS:  Has patient fallen in last 6 months?2 No  LIVING ENVIRONMENT: Lives with: lives with their family Lives in:  House/apartment  OCCUPATION: Not currently working (would like to get back)  PLOF: Independent  PATIENT GOALS: decrease shoulder pain, improve lifting, get back to working a part time job  OBJECTIVE:   DIAGNOSTIC FINDINGS:  N/A  PATIENT SURVEYS:  FOTO: 74% function; 66% predicted 04/01/2023: 57% function  COGNITION: Overall cognitive status: Within functional limits for tasks assessed     SENSATION: WFL  POSTURE: Rounded shoulders, fwd head  UPPER EXTREMITY ROM:   Active ROM Right eval Left eval Left 05/05/23  Shoulder flexion WNL WNL (with pain and hiking) WNL  Shoulder extension  Shoulder abduction WNL WNL (with pain and hiking) WNL  Shoulder adduction     Shoulder internal rotation WNL WNL with pain WNL  Shoulder external rotation WNL WNL WNL  (Blank rows = not tested)  UPPER EXTREMITY MMT:  MMT Right eval Left eval Left 04/01/2023  Shoulder flexion 5/5 5/5   Shoulder extension     Shoulder abduction 5/5 5/5   Shoulder adduction     Shoulder internal rotation 5/5 4/5 5/5  Shoulder external rotation 5/5 3+/5 with pain 4/5  Middle trapezius     Lower trapezius     Elbow flexion     Elbow extension     Wrist flexion     Wrist extension     Wrist ulnar deviation     Wrist radial deviation     Wrist pronation     Wrist supination     Grip strength (lbs)     (Blank rows = not tested)  SHOULDER SPECIAL TESTS: Impingement tests: Neer impingement test: negative and Painful arc test: positive  SLAP lesions: DNT Instability tests: DNT Rotator cuff assessment: DNT Biceps assessment: DNT  JOINT MOBILITY TESTING:  WNL  PALPATION:  TTP to L infraspinatus, L upper trap   TREATMENT: [facility] Adult PT Treatment:                                                DATE: 05/05/23 Therapeutic Exercise: UBE lvl 1.0 x 3 min while taking subjective Row 3x10 blue band L shoulder ER/IR 2x10 RTB Seated bilateral ER 2x15 GTB Seated horizontal abd 2x15 GTB Door way  pec stretch x 30" Manual Therapy: Assessment of tests/measures, goals, and outcomes for discharge  [facility] Adult PT Treatment:                                                DATE: 04/28/23 Therapeutic Exercise: UBE lvl 1.0 x 4 min while taking subjective Row 3x10 13# Shoulder ext 2x10 13# L shoulder ER 2x10 RTB L shoulder IR 2x10 GTB Seated bilateral ER 2x10 RTB Seated horizontal abd 3x10 RTB Serratus wall slide with soft foam 2x10 YTB Supine serratus raise 2x10 YTB Supine D2 flex 2x10 RTB L Sidelying shoulder abd 2x10 2# L Sidelying external rotation 2x10 2# L  [facility] Adult PT Treatment:                                                DATE: 04/21/23 Therapeutic Exercise: UBE lvl 1.0 x 4 min while taking subjective Row 2x12 17# Shoulder ext 2x10 17# L shoulder ER 2x15 RTB Serratus wall slide with soft foam 2x10 YTB L shoulder abd wall slide 2x10 Supine serratus raise 2x10 YTB Supine D2 flex 2x10 RTB L Supine horizontal abd 2x15 GTB L shoulder shelf taps flexion 2x10 2#  PATIENT EDUCATION: Education details: HEP update Person educated: Patient Education method: Explanation, Demonstration, and Handouts Education comprehension: verbalized understanding and returned demonstration  HOME EXERCISE PROGRAM: Access Code: [email or web address] URL: [email or web address] Date: 05/05/2023 Prepared by: Edwinna Areola  Exercises - Standing Shoulder Row with Anchored Resistance  - 3-4 x  weekly - 3 sets - 10 reps - blue band hold - Standing Shoulder Horizontal Abduction with Resistance  - 3-4 x weekly - 3 sets - 10 reps - green band hold - Shoulder External Rotation and Scapular Retraction with Resistance  - 3-4 x weekly - 2-3 sets - 15 reps - green band hold - Shoulder External Rotation with Anchored Resistance  - 3-4 x weekly - 2-3 sets - 15 reps - red band hold - Shoulder Internal Rotation with Resistance  - 3-4 x weekly - 3 sets - 10 reps - red band hold - Doorway Pec Stretch at 90  Degrees Abduction  - 3-4 x weekly - 2 reps - 30 sec hold  ASSESSMENT:  CLINICAL IMPRESSION: Pt was able to complete all prescribed exercises and demonstrated knowledge of HEP with no adverse effect. Over the course of PT treatment she has done will with progression of L shoulder ROM without pain and improving RTC strength. She should continue to improve with HEP compliance and is ready to discharge from skilled therapy at this time.    OBJECTIVE IMPAIRMENTS: decreased activity tolerance, decreased mobility, decreased ROM, decreased strength, impaired UE functional use, and pain.   ACTIVITY LIMITATIONS: carrying, lifting, and reach over head  PARTICIPATION LIMITATIONS: driving, shopping, community activity, occupation, and yard work  PERSONAL FACTORS: Time since onset of injury/illness/exacerbation and 1-2 comorbidities: HTN  are also affecting patient's functional outcome.    GOALS: Goals reviewed with patient? No  SHORT TERM GOALS: Target date: 02/11/2023   Pt will be compliant and knowledgeable with initial HEP for improved comfort and carryover Baseline: initial HEP given  Goal status: MET  2.  Pt will self report left shoulder pain no greater than 3/10 for improved comfort and functional ability Baseline: 5/10 at worst Goal status: MET   LONG TERM GOALS: Target date: 05/13/2023    Pt will improve FOTO function score to no less than 80% as proxy for functional improvement with home ADLs and community activities  Baseline: 74% function 04/01/2023: 57% function 05/05/2023: 78% function Goal status: PARTIALLY MET  2.  Pt will self report left shoulder pain no greater than 1/10 for improved comfort and functional ability Baseline: 5/10 at worst Goal status: MOSTLY MET   3.  Pt will improve left shoulder flexion/abd/IR to WNL without hiking compensation or pain through range for improved comfort and functional ability with home ADLs and community activity Baseline: See ROM  chart Goal status: MET  4.  Pt will improve left shoulder IR/ER strength to no less than 5/5 for improved dynamic stabilization with OH motions Baseline: see MMT chart Goal status: MOSTLY MET  5.  Pt will be able to lift 25# OH to cabinet at shoulder height for improved functional ability and potentially get back to working part time Baseline: unable Goal status: MET   PLAN:  PT FREQUENCY: 1x/week  PT DURATION: 6 weeks  PLANNED INTERVENTIONS: Therapeutic exercises, Therapeutic activity, Neuromuscular re-education, Balance training, Gait training, Patient/Family education, Self Care, Joint mobilization, Dry Needling, Electrical stimulation, Cryotherapy, Moist heat, Vasopneumatic device, Manual therapy, and Re-evaluation  PLAN FOR NEXT SESSION: assess HEP response, pec minor and upper trap stretching, RTC and periscapular strengthening   Eloy End, PT 05/05/2023, 11:53 AM

## 2023-12-13 ENCOUNTER — Emergency Department (HOSPITAL_COMMUNITY)
Admission: EM | Admit: 2023-12-13 | Discharge: 2023-12-13 | Disposition: A | Discharge status: Home / Self Care | Attending: Emergency Medicine | Admitting: Emergency Medicine

## 2023-12-13 ENCOUNTER — Encounter (HOSPITAL_COMMUNITY): Payer: Self-pay

## 2023-12-13 ENCOUNTER — Other Ambulatory Visit: Payer: Self-pay

## 2023-12-13 ENCOUNTER — Emergency Department (HOSPITAL_COMMUNITY)

## 2023-12-13 DIAGNOSIS — K5732 Diverticulitis of large intestine without perforation or abscess without bleeding: Secondary | ICD-10-CM | POA: Diagnosis not present

## 2023-12-13 DIAGNOSIS — R739 Hyperglycemia, unspecified: Secondary | ICD-10-CM | POA: Insufficient documentation

## 2023-12-13 DIAGNOSIS — I10 Essential (primary) hypertension: Secondary | ICD-10-CM | POA: Diagnosis not present

## 2023-12-13 DIAGNOSIS — R1031 Right lower quadrant pain: Secondary | ICD-10-CM | POA: Diagnosis present

## 2023-12-13 DIAGNOSIS — K5792 Diverticulitis of intestine, part unspecified, without perforation or abscess without bleeding: Secondary | ICD-10-CM

## 2023-12-13 LAB — URINALYSIS, ROUTINE W REFLEX MICROSCOPIC
Bilirubin Urine: NEGATIVE
Glucose, UA: NEGATIVE mg/dL
Hgb urine dipstick: NEGATIVE
Ketones, ur: NEGATIVE mg/dL
Leukocytes,Ua: NEGATIVE
Nitrite: NEGATIVE
Protein, ur: NEGATIVE mg/dL
Specific Gravity, Urine: 1.026 (ref 1.005–1.030)
pH: 7 (ref 5.0–8.0)

## 2023-12-13 LAB — CBC WITH DIFFERENTIAL/PLATELET
Abs Immature Granulocytes: 0.01 10*3/uL (ref 0.00–0.07)
Basophils Absolute: 0 10*3/uL (ref 0.0–0.1)
Basophils Relative: 1 %
Eosinophils Absolute: 0.1 10*3/uL (ref 0.0–0.5)
Eosinophils Relative: 2 %
HCT: 43.9 % (ref 36.0–46.0)
Hemoglobin: 14 g/dL (ref 12.0–15.0)
Immature Granulocytes: 0 %
Lymphocytes Relative: 21 %
Lymphs Abs: 1.3 10*3/uL (ref 0.7–4.0)
MCH: 27.3 pg (ref 26.0–34.0)
MCHC: 31.9 g/dL (ref 30.0–36.0)
MCV: 85.6 fL (ref 80.0–100.0)
Monocytes Absolute: 0.6 10*3/uL (ref 0.1–1.0)
Monocytes Relative: 10 %
Neutro Abs: 4 10*3/uL (ref 1.7–7.7)
Neutrophils Relative %: 66 %
Platelets: 148 10*3/uL — ABNORMAL LOW (ref 150–400)
RBC: 5.13 MIL/uL — ABNORMAL HIGH (ref 3.87–5.11)
RDW: 14.2 % (ref 11.5–15.5)
WBC: 6 10*3/uL (ref 4.0–10.5)
nRBC: 0 % (ref 0.0–0.2)

## 2023-12-13 LAB — COMPREHENSIVE METABOLIC PANEL WITH GFR
ALT: 10 U/L (ref 0–44)
AST: 19 U/L (ref 15–41)
Albumin: 3.5 g/dL (ref 3.5–5.0)
Alkaline Phosphatase: 52 U/L (ref 38–126)
Anion gap: 10 (ref 5–15)
BUN: 19 mg/dL (ref 8–23)
CO2: 28 mmol/L (ref 22–32)
Calcium: 9.6 mg/dL (ref 8.9–10.3)
Chloride: 102 mmol/L (ref 98–111)
Creatinine, Ser: 0.89 mg/dL (ref 0.44–1.00)
GFR, Estimated: >60 mL/min (ref >60)
Glucose, Bld: 119 mg/dL — ABNORMAL HIGH (ref 70–99)
Potassium: 4.1 mmol/L (ref 3.5–5.1)
Sodium: 140 mmol/L (ref 135–145)
Total Bilirubin: 0.4 mg/dL (ref 0.0–1.2)
Total Protein: 6.5 g/dL (ref 6.5–8.1)

## 2023-12-13 LAB — LIPASE, BLOOD: Lipase: 26 U/L (ref 11–51)

## 2023-12-13 MED ORDER — HYDROCODONE-ACETAMINOPHEN 5-325 MG PO TABS: 1.0000 | ORAL_TABLET | Freq: Four times a day (QID) | ORAL | 0 refills | Status: AC | PRN

## 2023-12-13 MED ORDER — ONDANSETRON HCL 4 MG/2ML IJ SOLN
4.0000 mg | Freq: Once | INTRAMUSCULAR | Status: AC
  Administered 2023-12-13: 4 mg via INTRAVENOUS
  Filled 2023-12-13: qty 2

## 2023-12-13 MED ORDER — IOHEXOL 350 MG/ML SOLN
75.0000 mL | Freq: Once | INTRAVENOUS | Status: AC | PRN
  Administered 2023-12-13: 75 mL via INTRAVENOUS

## 2023-12-13 MED ORDER — AMOXICILLIN-POT CLAVULANATE 875-125 MG PO TABS
1.0000 | ORAL_TABLET | Freq: Once | ORAL | Status: AC
  Administered 2023-12-13: 1 via ORAL
  Filled 2023-12-13: qty 1

## 2023-12-13 MED ORDER — MORPHINE SULFATE (PF) 4 MG/ML IV SOLN
4.0000 mg | Freq: Once | INTRAVENOUS | Status: AC
  Administered 2023-12-13: 4 mg via INTRAVENOUS
  Filled 2023-12-13: qty 1

## 2023-12-13 MED ORDER — AMOXICILLIN-POT CLAVULANATE 875-125 MG PO TABS: 1.0000 | ORAL_TABLET | Freq: Two times a day (BID) | ORAL | 0 refills | Status: AC

## 2023-12-13 NOTE — ED Triage Notes (Signed)
 Pt c/o lower abdominal pain x 1 week. Pt denies vomiting, diarrhea, or constipation. Pt states she has pain after she urinates.

## 2023-12-13 NOTE — Discharge Instructions (Addendum)
 Evaluation today revealed that you likely have diverticulitis.  This might been explains your symptoms.  I am starting you on Augmentin which is an antibiotic to treat the infection.  Also recommend that you follow-up with gastroenterology in the outpatient setting.  I also sent a few tablets of Norco to your pharmacy for pain but otherwise recommend using Tylenol ibuprofen. If you have  If you have worsening abdominal pain, develop a fever, nausea vomiting, bloody stools or any other concerning symptom please return to the ED for further evaluation.

## 2023-12-13 NOTE — ED Provider Notes (Addendum)
 [place] [facility] AT [facility] Provider Note   CSN: [id number] Arrival date & time: 12/13/23  0741     History  Chief Complaint  Patient presents with   Abdominal Pain   HPI Amy Baird is a 68 y.o. female with history of hypertension presenting for abdominal pain.  Started about a week ago.  Occurred in the morning after she woke up from sleep.  Pain is all about the lower abdomen but worse on the right.  Denies nausea vomiting diarrhea, urinary symptoms and abnormal vaginal bleeding and discharge.  The pain was notably worse this morning.  Also denies fever.  Encounter conducted with ASL interpreter.   Abdominal Pain      Home Medications Prior to Admission medications   Medication Sig Start Date End Date Taking? Authorizing Provider  amoxicillin-clavulanate (AUGMENTIN) 875-125 MG tablet Take 1 tablet by mouth every 12 (twelve) hours. 12/13/23  Yes Makenze Ellett K, PA-C  HYDROcodone-acetaminophen (NORCO/VICODIN) 5-325 MG tablet Take 1 tablet by mouth every 6 (six) hours as needed for up to 8 doses for severe pain (pain score 7-10). 12/13/23  Yes Janalee Mcmurray, PA-C  clindamycin  (CLEOCIN ) 150 MG capsule Take 2 capsules (300 mg total) by mouth every 6 (six) hours. 11/26/17   Kirichenko, Tatyana, PA-C  omeprazole  (PRILOSEC) 20 MG capsule Take 1 capsule (20 mg total) by mouth daily. 11/20/17   Earma Gloss, MD      Allergies    Patient has no known allergies.    Review of Systems   Review of Systems  Gastrointestinal:  Positive for abdominal pain.    Physical Exam Updated Vital Signs BP (!) 153/86   Pulse 67   Temp 98.2 F (36.8 C) (Oral)   Resp (!) 21   Ht 5\' 3"  (1.6 m)   Wt 49.9 kg   SpO2 100%   BMI 19.49 kg/m  Physical Exam Vitals and nursing note reviewed.  HENT:     Head: Normocephalic and atraumatic.     Mouth/Throat:     Mouth: Mucous membranes are moist.  Eyes:     General:        Right eye: No discharge.         Left eye: No discharge.     Conjunctiva/sclera: Conjunctivae normal.  Cardiovascular:     Rate and Rhythm: Normal rate and regular rhythm.     Pulses: Normal pulses.     Heart sounds: Normal heart sounds.  Pulmonary:     Effort: Pulmonary effort is normal.     Breath sounds: Normal breath sounds.  Abdominal:     General: Abdomen is flat.     Palpations: Abdomen is soft.     Tenderness: There is abdominal tenderness in the right lower quadrant. There is no guarding.  Skin:    General: Skin is warm and dry.  Neurological:     General: No focal deficit present.  Psychiatric:        Mood and Affect: Mood normal.     ED Results / Procedures / Treatments   Labs (all labs ordered are listed, but only abnormal results are displayed) Labs Reviewed  CBC WITH DIFFERENTIAL/PLATELET - Abnormal; Notable for the following components:      Result Value   RBC 5.13 (*)    Platelets 148 (*)    All other components within normal limits  COMPREHENSIVE METABOLIC PANEL WITH GFR - Abnormal; Notable for the following components:   Glucose, Bld 119 (*)  All other components within normal limits  URINALYSIS, ROUTINE W REFLEX MICROSCOPIC - Abnormal; Notable for the following components:   Color, Urine STRAW (*)    All other components within normal limits  LIPASE, BLOOD    EKG None  Radiology CT ABDOMEN PELVIS W CONTRAST Result Date: 12/13/2023 CLINICAL DATA:  Abdominal pain EXAM: CT ABDOMEN AND PELVIS WITH CONTRAST TECHNIQUE: Multidetector CT imaging of the abdomen and pelvis was performed using the standard protocol following bolus administration of intravenous contrast. RADIATION DOSE REDUCTION: This exam was performed according to the departmental dose-optimization program which includes automated exposure control, adjustment of the mA and/or kV according to patient size and/or use of iterative reconstruction technique. CONTRAST:  75mL OMNIPAQUE IOHEXOL 350 MG/ML SOLN COMPARISON:  CT of the  abdomen pelvis performed November 20, 2017 FINDINGS: Lower chest: No acute abnormality. Hepatobiliary: No focal liver abnormality is seen. No gallstones, gallbladder wall thickening, or biliary dilatation. Pancreas: Unremarkable. No pancreatic ductal dilatation or surrounding inflammatory changes. Spleen: Normal in size without focal abnormality. Adrenals/Urinary Tract: Small left renal cyst estimated at 12 mm. No hydronephrosis or significant nephrolithiasis. Urinary bladder is grossly unremarkable. Stomach/Bowel: The stomach is nondilated. No dilated loops of small bowel are appreciated. The appendix is well seen and there is no evidence of appendicitis. A moderate volume of stool is material is present in the colon, notably in the right colon. Diverticular changes are present. In the distal left colon there is a short segment of colon which demonstrates diffuse wall thickening which is adjacent to an area of diverticular change. The colon more distal to this segment is decompressed. There is no evidence of abscess formation or overt perforation. Mild inflammatory stranding is present in the adjacent fat. Vascular/Lymphatic: No significant vascular findings are present. No enlarged abdominal or pelvic lymph nodes. Reproductive: In the right pelvis there is a mixed heterogeneous density which is similar when compared to the previous exam and contains calcifications, most likely a heterogeneous uterine fibroid. Other: Small fat containing umbilical hernia. Musculoskeletal: Degenerative changes are present in the lower lumbar spine. IMPRESSION: 1. Wall thickening and inflammatory stranding involving a distal segment of left colon in the pelvis. The leading differential consideration is diverticulitis. There is no evidence of abscess formation or perforation. Following treatment, consideration should be given toward its GI consultation and endoscopic evaluation to exclude other etiologies. Electronically Signed   By:  Reagan Camera M.D.   On: 12/13/2023 10:10    Procedures Procedures    Medications Ordered in ED Medications  amoxicillin-clavulanate (AUGMENTIN) 875-125 MG per tablet 1 tablet (has no administration in time range)  ondansetron  (ZOFRAN ) injection 4 mg (4 mg Intravenous Given 12/13/23 0819)  morphine (PF) 4 MG/ML injection 4 mg (4 mg Intravenous Given 12/13/23 0819)  iohexol (OMNIPAQUE) 350 MG/ML injection 75 mL (75 mLs Intravenous Contrast Given 12/13/23 0945)    ED Course/ Medical Decision Making/ A&P                                 Medical Decision Making Amount and/or Complexity of Data Reviewed Labs: ordered. Radiology: ordered.  Risk Prescription drug management.   Initial Impression and Ddx 68 year old well-appearing female presenting for abdominal pain.  Exam notable for lower abdominal tenderness.  DDx includes appendicitis, kidney stone, ovarian torsion, diverticulitis, UTI, other. Patient PMH that increases complexity of ED encounter:  hypertension  Interpretation of Diagnostics - I independent reviewed and interpreted the  labs as followed: mild hyperglycemia  - I independently visualized the following imaging with scope of interpretation limited to determining acute life threatening conditions related to emergency care: CT ab/pelvis, which revealed findings concerning for diverticulitis  Patient Reassessment and Ultimate Disposition/Management Pain well-controlled.  CT findings suggestive of diverticulitis.  This likely explains etiology of her symptoms.  Started her on Augmentin.  Advised to follow-up with GI.  Sent home with a few tablets of Norco.  Also discussed conservative management at home.  Looks well, nontoxic and hemodynamically stable.  Feel she is safe for discharge.  Discussed return precautions.  Discharged in good condition.  Patient management required discussion with the following services or consulting groups:  None  Complexity of Problems  Addressed Acute complicated illness or Injury  Additional Data Reviewed and Analyzed Further history obtained from: Past medical history and medications listed in the EMR and Prior ED visit notes  Patient Encounter Risk Assessment Prescriptions   Final Clinical Impression(s) / ED Diagnoses Final diagnoses:  Diverticulitis    Rx / DC Orders ED Discharge Orders          Ordered    amoxicillin-clavulanate (AUGMENTIN) 875-125 MG tablet  Every 12 hours        12/13/23 1055    HYDROcodone-acetaminophen (NORCO/VICODIN) 5-325 MG tablet  Every 6 hours PRN        12/13/23 1059              Kazimir Hartnett K, PA-C 12/13/23 1059    Anayah Arvanitis K, PA-C 12/13/23 1059    Kingsley, Victoria K, DO 12/13/23 1120
